# Patient Record
Sex: Female | Born: 1950 | Race: White | Hispanic: No | Marital: Married | Smoking: Current every day smoker
Health system: Southern US, Community
[De-identification: ages and names within clinical notes are randomized; demographics above are authoritative.]

## PROBLEM LIST (undated history)

## (undated) DIAGNOSIS — L309 Dermatitis, unspecified: Secondary | ICD-10-CM

## (undated) DIAGNOSIS — G8929 Other chronic pain: Secondary | ICD-10-CM

## (undated) DIAGNOSIS — I10 Essential (primary) hypertension: Secondary | ICD-10-CM

## (undated) DIAGNOSIS — M545 Low back pain: Secondary | ICD-10-CM

## (undated) DIAGNOSIS — F419 Anxiety disorder, unspecified: Secondary | ICD-10-CM

## (undated) DIAGNOSIS — M546 Pain in thoracic spine: Secondary | ICD-10-CM

## (undated) DIAGNOSIS — M549 Dorsalgia, unspecified: Secondary | ICD-10-CM

## (undated) HISTORY — PX: ABDOMINAL HYSTERECTOMY: SHX81

---

## 1998-01-15 ENCOUNTER — Emergency Department (HOSPITAL_COMMUNITY): Admission: EM | Admit: 1998-01-15 | Discharge: 1998-01-15 | Payer: Self-pay | Admitting: Emergency Medicine

## 1999-03-10 ENCOUNTER — Inpatient Hospital Stay (HOSPITAL_COMMUNITY): Admission: AD | Admit: 1999-03-10 | Discharge: 1999-03-13 | Payer: Self-pay | Admitting: *Deleted

## 1999-08-03 ENCOUNTER — Encounter: Payer: Self-pay | Admitting: Emergency Medicine

## 1999-08-03 ENCOUNTER — Emergency Department (HOSPITAL_COMMUNITY): Admission: EM | Admit: 1999-08-03 | Discharge: 1999-08-03 | Payer: Self-pay | Admitting: Emergency Medicine

## 2005-09-20 ENCOUNTER — Emergency Department (HOSPITAL_COMMUNITY): Admission: EM | Admit: 2005-09-20 | Discharge: 2005-09-20 | Payer: Self-pay | Admitting: Emergency Medicine

## 2009-04-22 ENCOUNTER — Emergency Department (HOSPITAL_COMMUNITY): Admission: EM | Admit: 2009-04-22 | Discharge: 2009-04-22 | Payer: Self-pay | Admitting: Emergency Medicine

## 2011-02-24 ENCOUNTER — Emergency Department (HOSPITAL_COMMUNITY): Payer: Medicare Other

## 2011-02-24 ENCOUNTER — Inpatient Hospital Stay (HOSPITAL_COMMUNITY)
Admission: EM | Admit: 2011-02-24 | Discharge: 2011-03-03 | DRG: 305 | Disposition: A | Discharge status: Home / Self Care | Payer: Medicare Other | Attending: Internal Medicine | Admitting: Internal Medicine

## 2011-02-24 DIAGNOSIS — F17200 Nicotine dependence, unspecified, uncomplicated: Secondary | ICD-10-CM | POA: Diagnosis present

## 2011-02-24 DIAGNOSIS — D6489 Other specified anemias: Secondary | ICD-10-CM | POA: Diagnosis present

## 2011-02-24 DIAGNOSIS — R51 Headache: Secondary | ICD-10-CM | POA: Diagnosis present

## 2011-02-24 DIAGNOSIS — G2589 Other specified extrapyramidal and movement disorders: Secondary | ICD-10-CM | POA: Diagnosis present

## 2011-02-24 DIAGNOSIS — I498 Other specified cardiac arrhythmias: Secondary | ICD-10-CM | POA: Diagnosis not present

## 2011-02-24 DIAGNOSIS — I10 Essential (primary) hypertension: Principal | ICD-10-CM | POA: Diagnosis present

## 2011-02-24 DIAGNOSIS — F411 Generalized anxiety disorder: Secondary | ICD-10-CM | POA: Diagnosis present

## 2011-02-24 DIAGNOSIS — D72829 Elevated white blood cell count, unspecified: Secondary | ICD-10-CM | POA: Diagnosis present

## 2011-02-24 DIAGNOSIS — R579 Shock, unspecified: Secondary | ICD-10-CM | POA: Diagnosis present

## 2011-02-24 DIAGNOSIS — F329 Major depressive disorder, single episode, unspecified: Secondary | ICD-10-CM | POA: Diagnosis present

## 2011-02-24 DIAGNOSIS — E876 Hypokalemia: Secondary | ICD-10-CM | POA: Diagnosis not present

## 2011-02-24 DIAGNOSIS — R0789 Other chest pain: Secondary | ICD-10-CM | POA: Diagnosis not present

## 2011-02-24 DIAGNOSIS — Z79899 Other long term (current) drug therapy: Secondary | ICD-10-CM

## 2011-02-24 DIAGNOSIS — F1320 Sedative, hypnotic or anxiolytic dependence, uncomplicated: Secondary | ICD-10-CM | POA: Diagnosis present

## 2011-02-24 DIAGNOSIS — G4700 Insomnia, unspecified: Secondary | ICD-10-CM | POA: Diagnosis present

## 2011-02-24 DIAGNOSIS — E871 Hypo-osmolality and hyponatremia: Secondary | ICD-10-CM | POA: Diagnosis present

## 2011-02-24 DIAGNOSIS — IMO0002 Reserved for concepts with insufficient information to code with codable children: Secondary | ICD-10-CM | POA: Diagnosis not present

## 2011-02-24 DIAGNOSIS — F29 Unspecified psychosis not due to a substance or known physiological condition: Secondary | ICD-10-CM | POA: Diagnosis not present

## 2011-02-25 ENCOUNTER — Emergency Department (HOSPITAL_COMMUNITY): Payer: Medicare Other

## 2011-02-25 LAB — CARDIAC PANEL(CRET KIN+CKTOT+MB+TROPI)
Relative Index: 2.6 — ABNORMAL HIGH (ref 0.0–2.5)
Troponin I: <0.3 ng/mL (ref <0.30)

## 2011-02-25 LAB — URINE MICROSCOPIC-ADD ON

## 2011-02-25 LAB — URINALYSIS, ROUTINE W REFLEX MICROSCOPIC
Bilirubin Urine: NEGATIVE
Glucose, UA: 100 mg/dL — AB
Hgb urine dipstick: NEGATIVE
Ketones, ur: 40 mg/dL — AB
Leukocytes, UA: NEGATIVE
Nitrite: NEGATIVE
Protein, ur: 30 mg/dL — AB
Specific Gravity, Urine: 1.014 (ref 1.005–1.030)
Urobilinogen, UA: 0.2 mg/dL (ref 0.0–1.0)
pH: 7 (ref 5.0–8.0)

## 2011-02-25 LAB — CBC
HCT: 34.8 % — ABNORMAL LOW (ref 36.0–46.0)
HCT: 36.4 % (ref 36.0–46.0)
Hemoglobin: 11.9 g/dL — ABNORMAL LOW (ref 12.0–15.0)
Hemoglobin: 12.4 g/dL (ref 12.0–15.0)
MCH: 31.3 pg (ref 26.0–34.0)
MCH: 31.4 pg (ref 26.0–34.0)
MCHC: 34.1 g/dL (ref 30.0–36.0)
MCHC: 34.2 g/dL (ref 30.0–36.0)
MCV: 91.6 fL (ref 78.0–100.0)
MCV: 92.2 fL (ref 78.0–100.0)
Platelets: 295 10*3/uL (ref 150–400)
Platelets: 318 10*3/uL (ref 150–400)
RBC: 3.8 MIL/uL — ABNORMAL LOW (ref 3.87–5.11)
RBC: 3.95 MIL/uL (ref 3.87–5.11)
RDW: 13.8 % (ref 11.5–15.5)
RDW: 14 % (ref 11.5–15.5)
WBC: 11.6 10*3/uL — ABNORMAL HIGH (ref 4.0–10.5)
WBC: 14.4 10*3/uL — ABNORMAL HIGH (ref 4.0–10.5)

## 2011-02-25 LAB — DIFFERENTIAL
Eosinophils Absolute: 0.1 10*3/uL (ref 0.0–0.7)
Eosinophils Relative: 1 % (ref 0–5)
Lymphocytes Relative: 11 % — ABNORMAL LOW (ref 12–46)
Lymphocytes Relative: 6 % — ABNORMAL LOW (ref 12–46)
Lymphs Abs: 0.8 10*3/uL (ref 0.7–4.0)
Lymphs Abs: 1.2 10*3/uL (ref 0.7–4.0)
Monocytes Absolute: 0.7 10*3/uL (ref 0.1–1.0)
Monocytes Relative: 6 % (ref 3–12)
Monocytes Relative: 6 % (ref 3–12)
Neutrophils Relative %: 88 % — ABNORMAL HIGH (ref 43–77)

## 2011-02-25 LAB — BASIC METABOLIC PANEL
BUN: 13 mg/dL (ref 6–23)
Calcium: 9.1 mg/dL (ref 8.4–10.5)
Chloride: 86 mEq/L — ABNORMAL LOW (ref 96–112)
Creatinine, Ser: 0.52 mg/dL (ref 0.50–1.10)
GFR calc Af Amer: >60 mL/min (ref >60)

## 2011-02-25 LAB — COMPREHENSIVE METABOLIC PANEL
BUN: 17 mg/dL (ref 6–23)
CO2: 22 mEq/L (ref 19–32)
Calcium: 9.7 mg/dL (ref 8.4–10.5)
Chloride: 86 mEq/L — ABNORMAL LOW (ref 96–112)
Creatinine, Ser: 0.69 mg/dL (ref 0.50–1.10)
GFR calc Af Amer: >60 mL/min (ref >60)
GFR calc non Af Amer: >60 mL/min (ref >60)
Glucose, Bld: 153 mg/dL — ABNORMAL HIGH (ref 70–99)
Total Bilirubin: 0.5 mg/dL (ref 0.3–1.2)

## 2011-02-25 LAB — CK TOTAL AND CKMB (NOT AT ARMC)
CK, MB: 15.8 ng/mL (ref 0.3–4.0)
Relative Index: 3 — ABNORMAL HIGH (ref 0.0–2.5)
Total CK: 534 U/L — ABNORMAL HIGH (ref 7–177)

## 2011-02-25 LAB — MRSA PCR SCREENING: MRSA by PCR: NEGATIVE

## 2011-02-25 LAB — MAGNESIUM: Magnesium: 1.6 mg/dL (ref 1.5–2.5)

## 2011-02-25 LAB — POCT I-STAT TROPONIN I: Troponin i, poc: 0.01 ng/mL (ref 0.00–0.08)

## 2011-02-25 LAB — CK: Total CK: 546 U/L — ABNORMAL HIGH (ref 7–177)

## 2011-02-25 LAB — TROPONIN I: Troponin I: <0.3 ng/mL (ref <0.30)

## 2011-02-25 NOTE — H&P (Signed)
Ariana Gilbert, Ariana NO.:  [id number]  MEDICAL RECORD NO.:  [id number]  LOCATION:  WLED                         FACILITY:  [facility]  PHYSICIAN:  Talmage Nap, MD  DATE OF BIRTH:  Nov 20, 1950  DATE OF ADMISSION:  02/24/2011 DATE OF DISCHARGE:                             HISTORY & PHYSICAL   PRIMARY CARE PHYSICIAN:  [name], M.D.  History obtainable from the patient.  CHIEF COMPLAINTS:  Headache and dizziness which started at about 11:30 p.m. on 02/24/2011.  HISTORY OF PRESENT ILLNESS:  The patient is a 60 year old Caucasian female with history of anxiety disorder, depression, and hypertension ironically not on any medication, presenting to the emergency room with headache which started at about 11:30 p.m. on 02/24/2011.  The patient claimed that for months she has been having on and off frontal headache with no associated systemic symptoms, however, the present episode started suddenly at about 11:30 p.m. on 0912/2012.  Headache was said tobe throbbing, about 8/10 intensity, and was radiating to the back up to the occipital region.  This was said to be associated with dizzy spell and photophobia.  She claimed she was nauseated, but denied any vomiting.  She denied any fever.  She denied any chills.  She denied any rigor.  She denied any history of neck stiffness.  She denied any weakness on any side of the body.  She also denied any chest pain or shortness of breath.  The patient also denied any history of passing out.  Headache was said to have persisted, hence the patient called EMS who brought the patient to the hospital to be evaluated.  In the emergency room the patient was found to have markedly elevated blood pressure, BP was 195/107.  She was started on IV labetalol.  At the time the patient was seen by me in the emergency room, headache has remarkably subsided and her blood pressure was 129/76, pulse was 67, respiratory rate 12.  She  denied any associated blurry vision.  She denied any nausea or vomiting.  She denied any fever.  No chills.  No rigor.  She also denied any chest pain or shortness of breath.  After evaluation the patient was advised to be admitted for stabilization before being discharged.  PAST MEDICAL HISTORY: 1. Positive for hypertension - ironically not on any antihypertensive     medication. 2. Anxiety disorder. 3. Depression. 4. Periodontal disease.  PREADMISSION MEDS:  Include Xanax and Celexa.  ALLERGIES: 1. HALOPERIDOL. 2. PERCODAN. 3. TRAZODONE.  PAST SURGICAL HISTORY:  Hysterectomy and tonsillectomy.  SOCIAL HISTORY:  Positive for tobacco use.  Smokes about a pack of cigarettes a day for years and she claims she is a social drinker.  She is to be a retired Runner, broadcasting/film/video, but periodically works at an [employer].  FAMILY HISTORY:  Essentially positive for hypertension.  REVIEW OF SYSTEMS:  The patient presently denies any headaches.  No blurred vision.  No nausea or vomiting.  No fever.  No chills.  No rigor.  No chest pain or shortness of breath.  No cough.  No abdominal discomfort.  No diarrhea or hematochezia.  No dysuria or hematuria.  No swelling of the  lower extremities.  No intolerance to heat or cold and no neuropsychiatric disorder.  PHYSICAL EXAMINATION:  GENERAL:  On examination, middle-aged lady not in any respiratory distress, well hydrated, has flushing of the face. VITAL SIGNS:  At present vital signs are blood pressure 129/76, pulse 67, respiratory rate 20, pain scale is 12. HEENT:  Pupils are reactive to light and extraocular muscles are intact. NECK:  No jugular venous distention.  No carotid bruit.  No lymphadenopathy. Chest:  Clear to auscultation. HEART:  Sounds are 1 and 2. ABDOMEN:  Soft, nontender.  Liver, spleen and kidney not palpable. Bowel sounds are positive. EXTREMITIES:  No pedal edema. NEUROLOGIC EXAM:  Nonfocal. MUSCULOSKELETAL SYSTEM:   Unremarkable. SKIN:  Showed flushing on the face with area of erythema in the left lower leg with a silvery plaques.  LAB DATA:  Hematology indices showed WBC of 11.6, hemoglobin 12.4, hematocrit 36.4, MCV of 92.2 with a platelet count of 295, neutrophils 83% and absolute neutrophil count is 9.6.  Chemistry showed sodium of 122, potassium of 4.5, chloride of 88 with a bicarb of 19, glucose is 133, BUN is 13, creatinine 0.52.  Urinalysis unremarkable.  Urine microscopy showed wbc related to rare bacteria.  EKG showed normal sinus rhythm with a rate of 78 and LVH with nonspecific T-wave changes in the anterolateral leads.  CT of the head without contrast did not show any acute process, i.e., no hemorrhage and no mass seen.  IMPRESSION: 1. Hypertensive emergency, bp now is controlled. 2. Anxiety disorder. 3. Depression. 4. Leukocytosis. 5. Hyponatremia.  PLAN:  Plan is to admit the patient to telemetry.  The patient will be on saline lock.  She will be on aspirin 81 mg p.o. daily.  Blood pressure will be maintained with Lopressor 50 mg p.o. b.i.d., lisinopril 10 mg p.o. daily and diet will be controlled with Xanax 1 mg p.o. b.i.d. p.r.n. and depression with Celexa 40 mg p.o. daily.  Since the patient is found to have leukocytosis, she empirically will be treated with Levaquin 500 mg p.o. daily.  Headache will also be controlled with Dilaudid 0.5 mg IV q.4 h. p.r.n.  GI prophylaxis will be done with Protonix 40 mg p.o. daily and TED stockings for DVT prophylaxis.  Further workup to be done on this patient will include cardiac enzymes q.6 x3.  CBC, CMP and magnesium will be repeated in a.m.  Imaging study to be done will include 2-D echo and carotid duplex.  The patient will be followed and evaluated on day- to-day basis.     Talmage Nap, MD     CN/MEDQ  D:  02/25/2011  T:  02/25/2011  Job:  [id number]  Electronically Signed by Talmage Nap  on 02/25/2011 06:04:45 AM

## 2011-02-26 ENCOUNTER — Encounter (HOSPITAL_COMMUNITY): Payer: Self-pay

## 2011-02-26 ENCOUNTER — Inpatient Hospital Stay (HOSPITAL_COMMUNITY): Payer: Medicare Other

## 2011-02-26 DIAGNOSIS — Z79899 Other long term (current) drug therapy: Secondary | ICD-10-CM

## 2011-02-26 DIAGNOSIS — E871 Hypo-osmolality and hyponatremia: Secondary | ICD-10-CM

## 2011-02-26 DIAGNOSIS — I509 Heart failure, unspecified: Secondary | ICD-10-CM

## 2011-02-26 DIAGNOSIS — F05 Delirium due to known physiological condition: Secondary | ICD-10-CM

## 2011-02-26 DIAGNOSIS — R079 Chest pain, unspecified: Secondary | ICD-10-CM

## 2011-02-26 DIAGNOSIS — R42 Dizziness and giddiness: Secondary | ICD-10-CM

## 2011-02-26 DIAGNOSIS — F411 Generalized anxiety disorder: Secondary | ICD-10-CM

## 2011-02-26 LAB — COMPREHENSIVE METABOLIC PANEL
ALT: 18 U/L (ref 0–35)
AST: 26 U/L (ref 0–37)
CO2: 20 mEq/L (ref 19–32)
Calcium: 7.8 mg/dL — ABNORMAL LOW (ref 8.4–10.5)
GFR calc non Af Amer: >60 mL/min (ref >60)
Potassium: 3.8 mEq/L (ref 3.5–5.1)
Sodium: 119 mEq/L — CL (ref 135–145)
Total Protein: 6 g/dL (ref 6.0–8.3)

## 2011-02-26 LAB — CARDIAC PANEL(CRET KIN+CKTOT+MB+TROPI)
CK, MB: 13.9 ng/mL (ref 0.3–4.0)
Troponin I: <0.3 ng/mL (ref <0.30)

## 2011-02-26 LAB — RAPID URINE DRUG SCREEN, HOSP PERFORMED: Opiates: NOT DETECTED

## 2011-02-26 LAB — BASIC METABOLIC PANEL
BUN: 17 mg/dL (ref 6–23)
BUN: 18 mg/dL (ref 6–23)
BUN: 16 mg/dL (ref 6–23)
Calcium: 8.1 mg/dL — ABNORMAL LOW (ref 8.4–10.5)
Calcium: 8.8 mg/dL (ref 8.4–10.5)
Chloride: 91 mEq/L — ABNORMAL LOW (ref 96–112)
Chloride: 90 mEq/L — ABNORMAL LOW (ref 96–112)
Creatinine, Ser: 0.78 mg/dL (ref 0.50–1.10)
Creatinine, Ser: 0.73 mg/dL (ref 0.50–1.10)
GFR calc Af Amer: >60 mL/min (ref >60)
GFR calc Af Amer: >60 mL/min (ref >60)
GFR calc Af Amer: >60 mL/min (ref >60)
GFR calc Af Amer: >60 mL/min (ref >60)
GFR calc Af Amer: >60 mL/min (ref >60)
GFR calc non Af Amer: >60 mL/min (ref >60)
GFR calc non Af Amer: >60 mL/min (ref >60)
GFR calc non Af Amer: >60 mL/min (ref >60)
Glucose, Bld: 146 mg/dL — ABNORMAL HIGH (ref 70–99)
Potassium: 3.8 mEq/L (ref 3.5–5.1)
Potassium: 3.9 mEq/L (ref 3.5–5.1)
Sodium: 118 mEq/L — CL (ref 135–145)
Sodium: 121 mEq/L — ABNORMAL LOW (ref 135–145)

## 2011-02-26 LAB — IRON AND TIBC
Saturation Ratios: 22 % (ref 20–55)
TIBC: 331 ug/dL (ref 250–470)

## 2011-02-26 LAB — CBC
Hemoglobin: 10.4 g/dL — ABNORMAL LOW (ref 12.0–15.0)
MCHC: 33.9 g/dL (ref 30.0–36.0)
WBC: 11.7 10*3/uL — ABNORMAL HIGH (ref 4.0–10.5)

## 2011-02-26 LAB — DIFFERENTIAL
Basophils Absolute: 0 10*3/uL (ref 0.0–0.1)
Basophils Relative: 0 % (ref 0–1)
Monocytes Absolute: 1.3 10*3/uL — ABNORMAL HIGH (ref 0.1–1.0)
Neutro Abs: 9.1 10*3/uL — ABNORMAL HIGH (ref 1.7–7.7)
Neutrophils Relative %: 78 % — ABNORMAL HIGH (ref 43–77)

## 2011-02-26 LAB — TSH: TSH: 6.412 u[IU]/mL — ABNORMAL HIGH (ref 0.350–4.500)

## 2011-02-26 LAB — PROCALCITONIN: Procalcitonin: <0.1 ng/mL

## 2011-02-26 LAB — LACTIC ACID, PLASMA: Lactic Acid, Venous: 2.2 mmol/L (ref 0.5–2.2)

## 2011-02-26 LAB — NA AND K (SODIUM & POTASSIUM), RAND UR
Potassium Urine: 65 mEq/L
Sodium, Ur: 20 mEq/L

## 2011-02-26 LAB — VITAMIN B12: Vitamin B-12: 341 pg/mL (ref 211–911)

## 2011-02-26 LAB — CREATININE, URINE, RANDOM: Creatinine, Urine: 149.7 mg/dL

## 2011-02-26 LAB — FOLATE: Folate: >20 ng/mL

## 2011-02-26 LAB — LITHIUM LEVEL: Lithium Lvl: <0.25 mEq/L — ABNORMAL LOW (ref 0.80–1.40)

## 2011-02-27 LAB — T3, FREE: T3, Free: 3.4 pg/mL (ref 2.3–4.2)

## 2011-02-27 LAB — BASIC METABOLIC PANEL
BUN: 15 mg/dL (ref 6–23)
BUN: 12 mg/dL (ref 6–23)
CO2: 24 mEq/L (ref 19–32)
CO2: 28 mEq/L (ref 19–32)
Calcium: 8.9 mg/dL (ref 8.4–10.5)
Calcium: 8.6 mg/dL (ref 8.4–10.5)
Calcium: 8.8 mg/dL (ref 8.4–10.5)
Chloride: 97 mEq/L (ref 96–112)
Creatinine, Ser: 0.62 mg/dL (ref 0.50–1.10)
Creatinine, Ser: 0.55 mg/dL (ref 0.50–1.10)
Creatinine, Ser: 0.69 mg/dL (ref 0.50–1.10)
GFR calc Af Amer: >60 mL/min (ref >60)
GFR calc non Af Amer: >60 mL/min (ref >60)
Glucose, Bld: 111 mg/dL — ABNORMAL HIGH (ref 70–99)
Glucose, Bld: 124 mg/dL — ABNORMAL HIGH (ref 70–99)
Sodium: 130 mEq/L — ABNORMAL LOW (ref 135–145)

## 2011-02-28 DIAGNOSIS — E871 Hypo-osmolality and hyponatremia: Secondary | ICD-10-CM

## 2011-02-28 DIAGNOSIS — F331 Major depressive disorder, recurrent, moderate: Secondary | ICD-10-CM

## 2011-02-28 DIAGNOSIS — F411 Generalized anxiety disorder: Secondary | ICD-10-CM

## 2011-02-28 DIAGNOSIS — F05 Delirium due to known physiological condition: Secondary | ICD-10-CM

## 2011-02-28 LAB — GLUCOSE, CAPILLARY

## 2011-02-28 LAB — BASIC METABOLIC PANEL
BUN: 10 mg/dL (ref 6–23)
CO2: 28 mEq/L (ref 19–32)
Chloride: 99 mEq/L (ref 96–112)
Creatinine, Ser: 0.54 mg/dL (ref 0.50–1.10)
GFR calc Af Amer: >60 mL/min (ref >60)

## 2011-02-28 LAB — CBC
HCT: 32.3 % — ABNORMAL LOW (ref 36.0–46.0)
MCHC: 32.8 g/dL (ref 30.0–36.0)
RDW: 14.4 % (ref 11.5–15.5)

## 2011-03-01 ENCOUNTER — Ambulatory Visit (HOSPITAL_COMMUNITY): Payer: Medicare Other

## 2011-03-01 LAB — CBC
HCT: 33.3 % — ABNORMAL LOW (ref 36.0–46.0)
MCH: 30.7 pg (ref 26.0–34.0)
MCHC: 32.1 g/dL (ref 30.0–36.0)
MCV: 95.7 fL (ref 78.0–100.0)
RDW: 14.5 % (ref 11.5–15.5)

## 2011-03-01 LAB — BASIC METABOLIC PANEL
BUN: 10 mg/dL (ref 6–23)
Calcium: 9.1 mg/dL (ref 8.4–10.5)
Creatinine, Ser: 0.61 mg/dL (ref 0.50–1.10)
GFR calc Af Amer: >60 mL/min (ref >60)
GFR calc non Af Amer: >60 mL/min (ref >60)

## 2011-03-01 MED ORDER — TECHNETIUM TC 99M TETROFOSMIN IV KIT
30.0000 | PACK | Freq: Once | INTRAVENOUS | Status: AC | PRN
  Administered 2011-03-01: 30 via INTRAVENOUS

## 2011-03-01 MED ORDER — TECHNETIUM TC 99M TETROFOSMIN IV KIT
10.0000 | PACK | Freq: Once | INTRAVENOUS | Status: AC | PRN
  Administered 2011-03-01: 10 via INTRAVENOUS

## 2011-03-01 NOTE — Consult Note (Signed)
NAMEJARI, CAROLLO             ACCOUNT NO.:  [id number]  MEDICAL RECORD NO.:  [id number]  LOCATION:  1230                         FACILITY:  [facility]  PHYSICIAN:  David Towson T. Tara Wich, M.D.   DATE OF BIRTH:  1951-05-27  DATE OF CONSULTATION:  02/28/2011 DATE OF DISCHARGE:                                CONSULTATION   HISTORY OF PRESENT ILLNESS:  The patient is a 60 year old Caucasian female who is married and part-time employed, admitted on the medical floor due to increased blood pressure, having dizzy spells, and throbbing pain.  Apparently, the patient has been noncompliant with her medication.  She has a history of hypertension, depression, and anxiety disorder.  The patient also reported initially chest pain, but also reported being very depressed and anxious.  She requested to be seen by a psychiatrist.  The patient told that she is having more depressive symptoms in past few months.  Her father died on 2024-07-28who she was very close to her and since then she is having increased crying spells, anhedonia, feeling of hopeless and helpless, and poor sleep.  She has been seeing her primary care doctor, Dr. Johny Blamer who has been managing her psychiatric medication, Celexa 40 mg and Xanax 1 mg which she has been taking 5 times a day.  However, despite taking Xanax 5 times a day, her anxiety has been not under control.  She continued to have crying spells and feeling of worthless and hopeless.  She also admitted increased stress at her job.  She is a part-time Engineer, site, however, she is living on paycheck to paycheck.  She is under enormous financial distress and feels that she cannot take it anymore. Though she denies any active suicidal thinking, however, admitted having passive suicidal thoughts and sometimes no desire to live.  She denies any psychotic symptoms or having any visual or auditory hallucinations or any anger or violent behavior.  PAST PSYCHIATRIC HISTORY:   The patient admitted history of being admitted in the hospital at [facility] many years ago for severe depression, but she does not remember the details very well. She was also seen by Dr. Elyn Peers at [facility] many years ago, but she does not remember the details.  She also denies any previous history of suicidal attempt or prior response of any antidepressant.  She has been seeing and getting antidepressant by Dr. Tiburcio Pea for past 10 years and has been managed on Celexa 40 mg.  She is also getting Xanax 1 mg 5 times a day.  ALCOHOL AND DRUG HISTORY:  The patient admitted history of drinking alcohol on occasion, but denies any history of withdrawals, intoxication, or blackouts.  SOCIAL HISTORY:  The patient lives with her 2nd husband.  The patient admitted the husband has been very supportive.  The patient also has children from her first marriage who live close by and has been in contact with them.  FAMILY HISTORY:  The patient does not remember anybody of her family who has psychiatric illness.  ALLERGIES:  The patient is allergic to, 1. HALDOL. 2. TRAZODONE. 3. PERCODAN.  MEDICAL HISTORY:  The patient has a history of hypertension, recently  admitted with hypertensive emergency.  She also has hyponatremia, leukocytosis.  CURRENT MEDICATIONS: 1. Celexa 40 mg daily. 2. Xanax 1 mg 5 times a day. 3. Aspirin 81 mg daily. 4. She was started on antihypertensive medication on the medical     floor.  MENTAL STATUS EXAM:  The patient is a middle-aged woman who appears older than her stated age.  She maintained poor eye contact.  Her thought process was slow.  Her speech is also very soft.  At times, she was tearful.  She described her mood is depressed and anxious.  Her affect was constricted.  She denies any active suicidal thoughts or any visual or auditory hallucination, though endorsed passive suicidal thoughts with no plan.  Her speech is  soft, as mentioned above.  Her attention and concentration were also distracted at times, however, there were no psychosis including delusion or paranoia present.  She is alert and oriented x3.  Her recent memory was intact, however, she has difficulty recalling old events.  She is alert and oriented x3.  Her insight and judgment was fair.  Her impulse control were okay.  DIAGNOSES:  Axis I:  Major depressive disorder, recurrent. Axis II:  Deferred. Axis III:  See medical history. Axis IV:  Moderate. Axis V:  50-55.  PLAN:  I offered voluntary inpatient admission to the [facility] which the patient agreed at this time as she felt that she cannot take it anymore and like to be admitted until she gets better mentally. We will recommend transfer to [facility] when the patient is medically cleared for further inpatient treatment and stabilization.  Thank you for giving me opportunity to involve in the patient care.     Trenten Watchman T. Lolly Mustache, M.D.     STA/MEDQ  D:  02/28/2011  T:  02/28/2011  Job:  [id number]  Electronically Signed by Kathryne Sharper M.D. on 03/01/2011 03:04:12 PM

## 2011-03-02 DIAGNOSIS — F331 Major depressive disorder, recurrent, moderate: Secondary | ICD-10-CM

## 2011-03-04 LAB — CULTURE, BLOOD (ROUTINE X 2)
Culture  Setup Time: 201209140847
Culture: NO GROWTH

## 2011-03-07 LAB — METANEPHRINES, URINE, 24 HOUR
Metaneph Total, Ur: 489 mcg/24 h (ref 224–832)
Metanephrines, Ur: 154 mcg/24h (ref 90–315)
Volume, Urine-METAN: 2550 mL

## 2011-03-08 NOTE — Consult Note (Signed)
Ariana Gilbert, Ariana Gilbert NO.:  [id number]  MEDICAL RECORD NO.:  [id number]  LOCATION:  1230                         FACILITY:  [facility]  PHYSICIAN:  Madolyn Frieze. Jens Som, MD, FACCDATE OF BIRTH:  [birth year]  DATE OF CONSULTATION:  02/26/2011 DATE OF DISCHARGE:                                CONSULTATION   HISTORY:  [name] is a 60 year old female with a history of anxiety disorder, hypertension, whom I am asked to evaluate for chest pain.  The patient was admitted on September 12th with complaints of headache, nausea and vomiting, and elevated blood pressure.  Her initial sodium was 122 with a potassium of 4.5.  She was treated with Lopressor and subsequently developed hypotension.  Note, she has had a brain MRI since this admission, which showed mild global atrophy and mild-to-moderate white matter changes.  She apparently complained of an episode of chest pain and her CK-MB was elevated, but her troponin was normal. Cardiology is therefore asked to further evaluate.  Prior to admission, the patient does describe occasional dyspnea on exertion that she attributes to tobacco abuse.  There is no orthopnea, PND, or pedal edema.  She does not had exertional chest pain.  She also denies any chest pain since she has been in the hospital at this time.  Her medications at present include, 1. Aspirin 81 mg p.o. daily. 2. Dulcolax suppository. 3. Levaquin 500 mg p.o. daily. 4. Magnesium oxide. 5. Protonix 40 mg p.o. b.i.d. and p.r.n. medications.  ALLERGIES: 1. HALDOL. 2. TRAZODONE. 3. SULFA.  SOCIAL HISTORY:  She does smoke.  She occasionally consumes alcohol.  FAMILY HISTORY:  Negative for coronary artery disease.  PAST MEDICAL HISTORY: 1. Anxiety disorder. 2. She also has hypertension.  PAST SURGICAL HISTORY:  She has had a previous tonsillectomy and hysterectomy.  REVIEW OF SYSTEMS:  She denies any fever or chills, but she has had a productive cough.  She  has also complained of headaches.  There is no hemoptysis.  There is no dysphagia, odynophagia, melena, or hematochezia.  She has had nausea and vomiting.  There is no dysuria, hematuria, or other urological symptoms.  There is no orthopnea, PND, or pedal edema.  She has had problems with anxiety.  Remaining systems are negative.  PHYSICAL EXAMINATION:  VITAL SIGNS:  Her blood pressure is 104/60 and her is pulse 58.  Her temperature is 97.7.  She is 96% on room air. GENERAL:  She is well developed and well nourished in no acute distress at present. SKIN:  Warm and dry.  She does have some anxiety.  There is no peripheral clubbing. BACK:  Normal. HEENT:  Normal.  Normal eyelids. NECK:  Supple with normal upstroke bilaterally.  I cannot appreciate bruits or thyromegaly. CHEST:  Clear to auscultation.  No expansion. CARDIOVASCULAR:  Regular rhythm.  Normal S1, S2.  There are no murmurs, rubs, or gallops noted. ABDOMEN:  Nontender, distended.  Positive sounds.  No hepatosplenomegaly.  No mass appreciate.  There is no abdominal bruit. She has 2+ femoral pulses bilaterally.  No bruits. EXTREMITIES:  No edema that I could palpate.  No cords.  She has 2+ dorsalis pedis pulses bilaterally. NEURO:  Grossly intact.  LABORATORY DATA:  Sodium of 121.  Potassium is 3.8.  BUN and creatinine are 17 and 0.59.  A TSH is mildly elevated at 6.412.  Urine osmolality is 645.  Serum osmolality is 253.  Cortisol was 19.9.  Cardiac markers show mildly elevated CK-MB with the most recent being 13.9, but her troponins have all been normal.  Liver functions are normal.  Chest x- ray from today shows mild vascular congestion and cardiomegaly without significant pulmonary edema.  Electrocardiogram shows a sinus rhythm with inferolateral T-wave inversion.  There is left axis deviation.  There is no RV conduction delay.  DIAGNOSES: 1. Chest pain - the patient denies any chest pain to me at this time.      Her CK-MB is mildly elevated, but her troponins are normal.  We     will plan to await her echocardiogram.  If it is negative, then she     can have an outpatient Myoview once this hospitalizations complete     and further workup of her hyponatremia is complete. 2. Hyponatremia - the patient has significant hyponatremia and     evaluation is ongoing.  It may be worthwhile to have Nephrology see     the patient.  I will check urine metanephrines and catecholamines,     given her labile blood pressure to exclude pheochromocytoma.  I     think this is unlikely.  Note, her cortisol level was normal. 3. Anxiety disorder - this is being managed by primary care. 4. History of hypertension - her blood pressure medications on hold     and she has had problems with hypotension.     Madolyn Frieze Jens Som, MD, [facility]     BSC/MEDQ  D:  02/26/2011  T:  02/27/2011  Job:  [id number]  Electronically Signed by Olga Millers MD [facility] on 03/08/2011 04:54:09 PM

## 2011-03-31 NOTE — Discharge Summary (Signed)
[name], Gilbert             ACCOUNT NO.:  [id number]  MEDICAL RECORD NO.:  [id number]  LOCATION:  1230                         FACILITY:  [facility]  PHYSICIAN:  [name], MD      DATE OF BIRTH:  [birth year]  DATE OF ADMISSION:  02/24/2011 DATE OF DISCHARGE:  03/02/2011                              DISCHARGE SUMMARY   PRIMARY CARE PHYSICIAN:  [name], M.D.  DISCHARGE DIAGNOSES: 1. Episode of agitation, confusion, and fluctuation of blood pressure     thought to be possibility of serotonin syndrome. 2. Hyponatremia. 3. Hypotension, requesting pressor for a short period of time. 4. Anxiety disorder. 5. Depression. 6. Major depressive disorder. 7. Insomnia. 8. Anemia, with a stable hemoglobin of around 10.7 and further workup     as outpatient. 9. Subclinical hypothyroidism. 10.Chest pain, status post negative stress test. 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  PROCEDURE: 1. Status post arterial line. 2. Status post central line. 3. MRI of the brain, negative. 4. Chest x-ray, no acute cardiopulmonary process. 5. Stress test which did show no evidence reversibility, normal     function, estimated ejection fraction 64%. 6. Echocardiogram did show wall thickness, has a normal ejection     fraction 55% to 60%.  Doppler parameters consistent with abnormal     left ventricular relaxation, grade 1 diastolic dysfunction, mildly     thickened leaflet, mildly thickened aortic mitral valve.  CONSULTATIONS:  Critical care consulted and Cardiology consulted.  Psych also consulted.  HISTORY OF PRESENT ILLNESS:  This is a 60 year old Caucasian female, who is here for an anxiety disorder, on chronic Xanax dose, history of benzodiazepine dependence, depression, and hypertensive, not on medication, presented to the ED with headache, dizziness and photophobia.  The patient found to have a blood pressure of 195/107, and was on labetalol and it has remarkably subsided, had blood  pressure 129/76 and pulse rate was 67.  Denies any fever, no chills, no rigors and no meningismus sign.  Accordingly, the patient admitted to Step- Down.  Also, the patient noticed to have a sodium of 120 and 119. Unfortunately, drug screen was not done at that time and alcohol level was not done.  Secondary to agitation and confusion, the patient admitted to the Step-Down. 1. Confusion, agitation, fluctuating blood pressure, and as we     mentioned for blood pressure, the patient was started on Lopressor     and lisinopril, and blood pressure significantly dropped.     Requesting CCM consultation and central line and A line     preparation.  The patient placed on pressor for almost 2 days.     Evaluation felt that this could be serotonin syndrome.  The lactic     acid was normal.  Procalcitonin was normal.  There is no evidence     of septic shock in her.  Also, blood samples sent to rule out     pheochromocytoma including metanephrine and normetanephrine.  The     patient weaned off pressor.  Blood pressure currently mild elevated     at 114/87 and we will add Norvasc 2.5 mg very carefully with     titrating of her blood pressure medication.  2. Chest pain and some T-wave inversion on inferior lead.  Cardiology     consulted and the patient underwent 2-D echo and a stress test     which did not show any evidence of ischemia. 3. Secondary to agitation and benzodiazepine dependence, Psychiatry     consulted and the patient offered to voluntarily commit to     [facility] for further treatment and the patient agreed. 4. Smoking.  Nicotine patch. 5. Possibility of Celexa poisoning.  We cannot exclude it with current     symptoms of agitation, confusion, and fluctuating blood pressure.     Also, there is no full picture of serotonin syndrome which includes     hyperthermia and tachycardia, but with the hyponatremia, also high     susceptibility, this could be secondary to Celexa.   Accordingly,     Celexa discontinued and we recommended Behavioral Health,     psychiatrist to recommend further medication for depression. 6. Insomnia to be managed by Psych.  DISCHARGE MEDICATIONS:  Currently discharge medications will be: 1. Norvasc 2.5 mg p.o. daily. 2. Aspirin 81 mg p.o. daily. 3. Xanax 0.5 mg p.o. b.i.d., __________ the patient use 1 mg p.o. 5     times a day. 4. Nicotine patch 21 mg transdermally daily for 21 days.  Currently, we feel the patient is medically stable to be transferred to [facility] if bed available, otherwise the patient will be transferred to regular floor.     [name], MD     HIE/MEDQ  D:  03/02/2011  T:  03/02/2011  Job:  [id number]  cc:   Holley Bouche, M.D. Fax: [phone]  Electronically Signed by Ebony Cargo MD on 03/31/2011 02:12:28 PM

## 2011-06-04 ENCOUNTER — Emergency Department (HOSPITAL_COMMUNITY)
Admission: EM | Admit: 2011-06-04 | Discharge: 2011-06-04 | Disposition: A | Discharge status: Home / Self Care | Payer: Medicare Other | Attending: Emergency Medicine | Admitting: Emergency Medicine

## 2011-06-04 ENCOUNTER — Encounter (HOSPITAL_COMMUNITY): Payer: Self-pay | Admitting: *Deleted

## 2011-06-04 ENCOUNTER — Other Ambulatory Visit: Payer: Self-pay

## 2011-06-04 DIAGNOSIS — F419 Anxiety disorder, unspecified: Secondary | ICD-10-CM

## 2011-06-04 DIAGNOSIS — F411 Generalized anxiety disorder: Secondary | ICD-10-CM | POA: Insufficient documentation

## 2011-06-04 DIAGNOSIS — R079 Chest pain, unspecified: Secondary | ICD-10-CM | POA: Insufficient documentation

## 2011-06-04 DIAGNOSIS — F17200 Nicotine dependence, unspecified, uncomplicated: Secondary | ICD-10-CM | POA: Insufficient documentation

## 2011-06-04 HISTORY — DX: Anxiety disorder, unspecified: F41.9

## 2011-06-04 HISTORY — DX: Essential (primary) hypertension: I10

## 2011-06-04 LAB — RAPID URINE DRUG SCREEN, HOSP PERFORMED
Amphetamines: NOT DETECTED
Barbiturates: NOT DETECTED
Benzodiazepines: POSITIVE — AB
Cocaine: NOT DETECTED
Opiates: NOT DETECTED
Tetrahydrocannabinol: NOT DETECTED

## 2011-06-04 LAB — URINALYSIS, ROUTINE W REFLEX MICROSCOPIC
Bilirubin Urine: NEGATIVE
Glucose, UA: NEGATIVE mg/dL
Hgb urine dipstick: NEGATIVE
Specific Gravity, Urine: 1.01 (ref 1.005–1.030)
Urobilinogen, UA: 0.2 mg/dL (ref 0.0–1.0)
pH: 6.5 (ref 5.0–8.0)

## 2011-06-04 LAB — COMPREHENSIVE METABOLIC PANEL
ALT: 31 U/L (ref 0–35)
AST: 29 U/L (ref 0–37)
Albumin: 4.5 g/dL (ref 3.5–5.2)
CO2: 25 mEq/L (ref 19–32)
Calcium: 10 mg/dL (ref 8.4–10.5)
Chloride: 93 mEq/L — ABNORMAL LOW (ref 96–112)
GFR calc non Af Amer: 69 mL/min — ABNORMAL LOW (ref >90)
Sodium: 131 mEq/L — ABNORMAL LOW (ref 135–145)

## 2011-06-04 LAB — CBC
Platelets: 228 10*3/uL (ref 150–400)
RDW: 13.9 % (ref 11.5–15.5)
WBC: 6.3 10*3/uL (ref 4.0–10.5)

## 2011-06-04 LAB — POCT I-STAT TROPONIN I: Troponin i, poc: 0 ng/mL (ref 0.00–0.08)

## 2011-06-04 LAB — DIFFERENTIAL
Basophils Absolute: 0 10*3/uL (ref 0.0–0.1)
Basophils Relative: 0 % (ref 0–1)
Eosinophils Relative: 1 % (ref 0–5)
Lymphocytes Relative: 12 % (ref 12–46)
Neutro Abs: 4.8 10*3/uL (ref 1.7–7.7)

## 2011-06-04 MED ORDER — BENZONATATE 100 MG PO CAPS
100.0000 mg | ORAL_CAPSULE | Freq: Once | ORAL | Status: AC
  Administered 2011-06-04: 100 mg via ORAL
  Filled 2011-06-04: qty 1

## 2011-06-04 MED ORDER — LORAZEPAM 1 MG PO TABS
1.0000 mg | ORAL_TABLET | Freq: Three times a day (TID) | ORAL | Status: DC | PRN
  Administered 2011-06-04: 1 mg via ORAL
  Filled 2011-06-04: qty 1

## 2011-06-04 MED ORDER — ACETAMINOPHEN 325 MG PO TABS: 650.0000 mg | ORAL_TABLET | ORAL | Status: DC | PRN

## 2011-06-04 MED ORDER — LORAZEPAM 1 MG PO TABS
2.0000 mg | ORAL_TABLET | Freq: Once | ORAL | Status: AC
  Administered 2011-06-04: 2 mg via ORAL
  Filled 2011-06-04: qty 2
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
## 2011-06-04 NOTE — ED Notes (Signed)
Pt having difficulty answering questions & staying on track.

## 2011-06-04 NOTE — ED Notes (Signed)
Per pt. Husband, states that he is worried about pt. Falling recently at home, states that she fell in the kitchen the other day.  Pt. States no injury, husband concerned that she will fall and injury herself.

## 2011-06-04 NOTE — ED Notes (Signed)
Pt states "I'm losing my license, I have a hx of anxiety, Dr. Leonides Sake, my primary physician tx's me for anxiety"; pt is not tearful @ present

## 2011-06-04 NOTE — ED Notes (Signed)
Pt. Given sandwich/drink at 1145, ate 100%

## 2011-06-04 NOTE — ED Notes (Signed)
Pt.'s husband at bedside with pt.

## 2011-06-04 NOTE — ED Notes (Signed)
Pt.'s husband called to request pt. Be evaluated for ETOH r/t DUI on 06-02-11 and court date in 1-13, per pt.'s son who is a Emergency planning/management officer.
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
## 2011-06-04 NOTE — ED Provider Notes (Signed)
Pt has been evaluated by ACT, do not feel that she needs inpatient tx.  No SI/HI.  Has contracted for safety.  Has been given resources for outpt follow up  Rolan Bucco, MD 06/04/11 2006

## 2011-12-21 ENCOUNTER — Telehealth: Payer: Self-pay | Admitting: Cardiology

## 2011-12-21 NOTE — Telephone Encounter (Signed)
New msg Pt wanted to talk to you about hospital stay last September. Please call

## 2011-12-21 NOTE — Telephone Encounter (Signed)
Spoke with pt, she wanted to make dr Jens Som aware she has concerns regarding the ER visit and the care she received at [facility] during her 02-2011 admission. She is writing a letter to dispute that visit and care and wanted to make sure dr Jens Som was aware in case he was contacted. Dr Jens Som made aware of call and pt concerns.

## 2012-07-02 ENCOUNTER — Emergency Department (HOSPITAL_COMMUNITY): Payer: Medicare Other

## 2012-07-02 ENCOUNTER — Emergency Department (HOSPITAL_COMMUNITY)
Admission: EM | Admit: 2012-07-02 | Discharge: 2012-07-02 | Disposition: A | Discharge status: Home / Self Care | Payer: Medicare Other | Attending: Emergency Medicine | Admitting: Emergency Medicine

## 2012-07-02 ENCOUNTER — Encounter (HOSPITAL_COMMUNITY): Payer: Self-pay

## 2012-07-02 DIAGNOSIS — F17200 Nicotine dependence, unspecified, uncomplicated: Secondary | ICD-10-CM | POA: Insufficient documentation

## 2012-07-02 DIAGNOSIS — I10 Essential (primary) hypertension: Secondary | ICD-10-CM | POA: Insufficient documentation

## 2012-07-02 DIAGNOSIS — R079 Chest pain, unspecified: Secondary | ICD-10-CM

## 2012-07-02 DIAGNOSIS — F411 Generalized anxiety disorder: Secondary | ICD-10-CM | POA: Insufficient documentation

## 2012-07-02 DIAGNOSIS — Z79899 Other long term (current) drug therapy: Secondary | ICD-10-CM | POA: Insufficient documentation

## 2012-07-02 DIAGNOSIS — M545 Low back pain, unspecified: Secondary | ICD-10-CM | POA: Insufficient documentation

## 2012-07-02 DIAGNOSIS — F419 Anxiety disorder, unspecified: Secondary | ICD-10-CM

## 2012-07-02 DIAGNOSIS — G8929 Other chronic pain: Secondary | ICD-10-CM | POA: Insufficient documentation

## 2012-07-02 DIAGNOSIS — R0602 Shortness of breath: Secondary | ICD-10-CM | POA: Insufficient documentation

## 2012-07-02 DIAGNOSIS — R0789 Other chest pain: Secondary | ICD-10-CM | POA: Insufficient documentation

## 2012-07-02 DIAGNOSIS — Z872 Personal history of diseases of the skin and subcutaneous tissue: Secondary | ICD-10-CM | POA: Insufficient documentation

## 2012-07-02 HISTORY — DX: Dermatitis, unspecified: L30.9

## 2012-07-02 LAB — POCT I-STAT, CHEM 8
Creatinine, Ser: 0.9 mg/dL (ref 0.50–1.10)
HCT: 39 % (ref 36.0–46.0)
Hemoglobin: 13.3 g/dL (ref 12.0–15.0)
Potassium: 4.4 mEq/L (ref 3.5–5.1)
Sodium: 143 mEq/L (ref 135–145)
TCO2: 27 mmol/L (ref 0–100)

## 2012-07-02 LAB — POCT I-STAT TROPONIN I: Troponin i, poc: 0 ng/mL (ref 0.00–0.08)

## 2012-07-02 LAB — CBC
MCH: 30.5 pg (ref 26.0–34.0)
MCHC: 33.1 g/dL (ref 30.0–36.0)
Platelets: 261 10*3/uL (ref 150–400)
RDW: 14.4 % (ref 11.5–15.5)

## 2012-07-02 LAB — GLUCOSE, CAPILLARY: Glucose-Capillary: 90 mg/dL (ref 70–99)

## 2012-07-02 MED ORDER — LORAZEPAM 2 MG/ML IJ SOLN
1.0000 mg | Freq: Once | INTRAMUSCULAR | Status: AC
  Administered 2012-07-02: 1 mg via INTRAVENOUS
  Filled 2012-07-02: qty 1

## 2012-07-02 MED ORDER — KETOROLAC TROMETHAMINE 30 MG/ML IJ SOLN
30.0000 mg | Freq: Once | INTRAMUSCULAR | Status: AC
  Administered 2012-07-02: 30 mg via INTRAVENOUS
  Filled 2012-07-02: qty 1

## 2012-07-02 NOTE — ED Notes (Signed)
Pt states she has had back pain and nausea for 2 months.

## 2012-07-02 NOTE — ED Notes (Signed)
Patient transported to CT 

## 2012-07-02 NOTE — ED Notes (Signed)
Per EMS, Pt started having chest pain while sleeping. Pt describing it as a tightness in the left chest. Non radiating. Pt did state she was nauseous, but denied medicine until "we figured out what was wrong with her." On arrival to scene paramedics describe scene as anxiety attack with rapid breathing and hard to get control of breathing. Pt does have hx of anxiety. Pt states it does feel like panic attack. Pt states she was worried because it was worse than normal and thought it was cardiac related. Pt has taken 324mg  of aspirin. Pt has not had nitro. Pt was pain free through out transport. 18 G in L hand. A/o x4, pt is still pain free at this moment.

## 2012-07-02 NOTE — ED Provider Notes (Signed)
History     CSN: [id number]  Arrival date & time 07/02/12  0248   First MD Initiated Contact with Patient 07/02/12 0319      Chief Complaint  Patient presents with  . Chest Pain    (Consider location/radiation/quality/duration/timing/severity/associated sxs/prior treatment) HPI Comments: 62 year old female with a history of anxiety and panic attacks and high blood pressure who presents with a complaint of left-sided chest pain and lower back pain.  #1 the chest pain awoke the patient from her sleep this evening, left-sided, heavy and pressure which is gradually improved and almost totally gone away. In fact she is not having any chest pain on the time of evaluation. This was associated with shortness of breath, felt like her prior panic attack and was associated with anxiety. She did not get nauseated, there was no diaphoresis and she has had no swelling in her legs or other risk factors for pulmonary embolism.  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 She does not have any other pathologic signs or symptoms and states that she takes Tylenol with some relief intermittently.  Patient is a 62 y.o. female presenting with chest pain. The history is provided by the patient and the EMS personnel.  Chest Pain     Past Medical History  Diagnosis Date  . Anxiety   . Hypertension   . Anxiety   . Eczema     Past Surgical History  Procedure Date  . Abdominal hysterectomy     No family history on file.  History  Substance Use Topics  . Smoking status: Current Every Day Smoker -- 1.0 packs/day  . Smokeless tobacco: Not on file  . Alcohol Use: Yes     Comment: "I probably drink too much wine"    OB History    Grav Para Term Preterm Abortions TAB SAB Ect Mult Living                  Review of Systems    Cardiovascular: Positive for chest pain.  All other systems reviewed and are negative.    Allergies  Celexa; Haldol; and Oxycodone  Home Medications   Current Outpatient Rx  Name  Route  Sig  Dispense  Refill  . ACETAMINOPHEN 500 MG PO TABS   Oral   Take 1,000 mg by mouth every 6 (six) hours as needed. For pain         . ALPRAZOLAM 1 MG PO TABS   Oral   Take 1 mg by mouth 5 (five) times daily.          Marland Kitchen DIPHENHYDRAMINE HCL 25 MG PO TABS   Oral   Take 25-50 mg by mouth every 6 (six) hours as needed. For itching         . HYDROCORTISONE 1 % EX CREA   Topical   Apply 1 application topically daily as needed. For itching           BP 144/74  Pulse 68  Temp 97.8 F (36.6 C) (Oral)  Resp 20  SpO2 99%  Physical Exam  Nursing note and vitals reviewed. Constitutional: She appears well-developed and well-nourished. No distress.  HENT:  Head: Normocephalic and atraumatic.  Mouth/Throat: Oropharynx is clear and moist. No oropharyngeal exudate.  Eyes: Conjunctivae normal and EOM are  normal. Pupils are equal, round, and reactive to light. Right eye exhibits no discharge. Left eye exhibits no discharge. No scleral icterus.  Neck: Normal range of motion. Neck supple. No JVD present. No thyromegaly present.  Cardiovascular: Normal rate, regular rhythm, normal heart sounds and intact distal pulses.  Exam reveals no gallop and no friction rub.   No murmur heard. Pulmonary/Chest: Effort normal and breath sounds normal. No respiratory distress. She has no wheezes. She has no rales.  Abdominal: Soft. Bowel sounds are normal. She exhibits no distension and no mass. There is no tenderness.  Musculoskeletal: Normal range of motion. She exhibits tenderness ( Tenderness to palpation in the right lower back, no spinal tenderness). She exhibits no edema.  Lymphadenopathy:    She has no cervical adenopathy.  Neurological: She is alert. Coordination normal.       Normal strength and  sensation of the lower extremities, normal speech, cranial nerves III through XII intact  Skin: Skin is warm and dry. No rash noted. No erythema.  Psychiatric: She has a normal mood and affect. Her behavior is normal.    ED Course  Procedures (including critical care time)   Labs Reviewed  GLUCOSE, CAPILLARY  CBC  POCT I-STAT, CHEM 8  POCT I-STAT TROPONIN I  POCT I-STAT TROPONIN I   Dg Chest 2 View  07/02/2012  *RADIOLOGY REPORT*  Clinical Data: Shortness of breath.  Chest pain.  CHEST - 2 VIEW  Comparison: 02/26/2011  Findings: Borderline heart size with normal pulmonary vascularity. Hyperinflation of the lungs.  This may reflect emphysema or asthma. No focal airspace consolidation.  No pneumothorax.  No blunting of costophrenic angles.  Mediastinal contours appear intact.  Tortuous aorta.  Degenerative changes in the spine.  IMPRESSION: Hyperinflation of the lungs suggesting emphysema or asthma.  No evidence of active pulmonary disease.   Original Report Authenticated By: Burman Nieves, M.D.    Dg Lumbar Spine Complete  07/02/2012  *RADIOLOGY REPORT*  Clinical Data: Low back pain.  LUMBAR SPINE - COMPLETE 4+ VIEW  Comparison: None.  Findings: Five lumbar type vertebrae. Slight anterior subluxation of L4 on L5.  Otherwise normal alignment of the lumbar vertebrae and facet joints.  Mild endplate hypertrophic changes in the mid and lower thoracic spine. No vertebral compression deformities. Intervertebral disc space heights are preserved.  No focal bone lesion or bone destruction.  Bone cortex and trabecular architecture appear intact.  IMPRESSION: Slight anterior subluxation of L4 on L5 likely due to degenerative change.  No displaced fractures identified.   Original Report Authenticated By: Burman Nieves, M.D.      1. Chest pain   2. Anxiety       MDM  The patient is not have any red flags for pathologic back pain, I suspect this is strain of the lumbosacral area and chronic pain.  There is no neurologic deficits. Her chest x-ray has been ordered, laboratory work, EKG shows left axis deviation with poor R-wave progression but no signs of acute ischemia and is unchanged from a prior EKG from 06/04/2011.   ED ECG REPORT  I personally interpreted this EKG   Date: 07/02/2012   Rate: 76  Rhythm: normal sinus rhythm  QRS Axis: left  Intervals: normal  ST/T Wave abnormalities: normal  Conduction Disutrbances:none  Narrative Interpretation: Poor R-wave progression  Old EKG Reviewed: unchanged and Compared with 06/04/2011  ED ECG REPORT  I personally interpreted this EKG   Date: 07/02/2012  0507 AM  Rate: 67  Rhythm: normal sinus rhythm  QRS Axis: left  Intervals: normal  ST/T Wave abnormalities: normal  Conduction Disutrbances:none  Narrative Interpretation: PRWP  Old EKG Reviewed: unchanged  Second troponin normal, repeat testing is normal, pt stable for d/c.  States she felt much better after medicines including ativan - VS with mild hypertension.         Vida Roller, MD 07/02/12 [phone]

## 2012-10-15 ENCOUNTER — Emergency Department (HOSPITAL_COMMUNITY)
Admission: EM | Admit: 2012-10-15 | Discharge: 2012-10-15 | Disposition: A | Discharge status: Home / Self Care | Payer: Medicare Other | Attending: Emergency Medicine | Admitting: Emergency Medicine

## 2012-10-15 ENCOUNTER — Encounter (HOSPITAL_COMMUNITY): Payer: Self-pay | Admitting: Emergency Medicine

## 2012-10-15 ENCOUNTER — Emergency Department (HOSPITAL_COMMUNITY): Payer: Medicare Other

## 2012-10-15 DIAGNOSIS — M545 Low back pain, unspecified: Secondary | ICD-10-CM | POA: Insufficient documentation

## 2012-10-15 DIAGNOSIS — G8929 Other chronic pain: Secondary | ICD-10-CM | POA: Insufficient documentation

## 2012-10-15 DIAGNOSIS — R52 Pain, unspecified: Secondary | ICD-10-CM | POA: Insufficient documentation

## 2012-10-15 DIAGNOSIS — L259 Unspecified contact dermatitis, unspecified cause: Secondary | ICD-10-CM | POA: Insufficient documentation

## 2012-10-15 DIAGNOSIS — M549 Dorsalgia, unspecified: Secondary | ICD-10-CM

## 2012-10-15 DIAGNOSIS — L309 Dermatitis, unspecified: Secondary | ICD-10-CM

## 2012-10-15 DIAGNOSIS — F17200 Nicotine dependence, unspecified, uncomplicated: Secondary | ICD-10-CM | POA: Insufficient documentation

## 2012-10-15 DIAGNOSIS — F411 Generalized anxiety disorder: Secondary | ICD-10-CM | POA: Insufficient documentation

## 2012-10-15 DIAGNOSIS — Z79899 Other long term (current) drug therapy: Secondary | ICD-10-CM | POA: Insufficient documentation

## 2012-10-15 DIAGNOSIS — I10 Essential (primary) hypertension: Secondary | ICD-10-CM | POA: Insufficient documentation

## 2012-10-15 LAB — URINALYSIS, ROUTINE W REFLEX MICROSCOPIC
Nitrite: NEGATIVE
Specific Gravity, Urine: 1.009 (ref 1.005–1.030)
Urobilinogen, UA: 0.2 mg/dL (ref 0.0–1.0)

## 2012-10-15 MED ORDER — HYDROMORPHONE HCL PF 1 MG/ML IJ SOLN
1.0000 mg | Freq: Once | INTRAMUSCULAR | Status: AC
  Administered 2012-10-15: 1 mg via INTRAMUSCULAR
  Filled 2012-10-15: qty 1

## 2012-10-15 MED ORDER — ONDANSETRON HCL 4 MG/2ML IJ SOLN
4.0000 mg | Freq: Once | INTRAMUSCULAR | Status: AC
  Administered 2012-10-15: 4 mg via INTRAMUSCULAR
  Filled 2012-10-15: qty 2

## 2012-10-15 MED ORDER — TRIAMCINOLONE ACETONIDE 0.5 % EX OINT: TOPICAL_OINTMENT | Freq: Two times a day (BID) | CUTANEOUS | Status: AC

## 2012-10-15 NOTE — ED Notes (Signed)
Pt states that she has worked at the same place for the past few years and she has been sitting at her job for a majority of those years. Pt back and lumbar region are hurting her, pt ambulatory, pt denies numbness or tingling.

## 2012-10-15 NOTE — ED Notes (Signed)
PT. REPORTS CHRONIC RIGHT LOWER BACK PAIN FOR 6 MONTHS GOT WORSE THESE PAST SEVERAL DAYS DENIES RECENT FALL OR INJURY UNRELIEVED BY PRESCRIPTION PAIN MEDICATIONS, AMBULATORY.

## 2012-10-15 NOTE — ED Provider Notes (Signed)
History    This chart was scribed for non-physician practitioner Garlon Hatchet working with Richardean Canal, MD by Donne Anon, ED Scribe. This patient was seen in room TR11C/TR11C and the patient's care was started at 2048.   CSN: [id number]  Arrival date & time 10/15/12  1191   First MD Initiated Contact with Patient 10/15/12 2048      Chief Complaint  Patient presents with  . Back Pain     The history is provided by the patient. No language interpreter was used.   Ariana Gilbert is a 62 y.o. female who presents to the Emergency Department complaining of gradual onset, chronic severe right lower back pain which began 6 months ago and got worse over the past few days. She states that the pain worse when she is standing. She has denies any recent trauma or fall. Notes the pain became severe after attempting to do sit-ups earlier this morning.  She has tried Tramadol, Hydrocortisone, and Oxycodone without relief. Has a previously scheduled appointment with [facility] tomorrow for evaluation of this pain.  Denies any urinary frequency, dysuria, or hematuria.  Denies any numbness or paresthesias of LE. No loss of bowel or bladder function. She also states that she has dry skin over her lower legs and palms of her hands which she thinks might be eczema.   Past Medical History  Diagnosis Date  . Anxiety   . Hypertension   . Anxiety   . Eczema     Past Surgical History  Procedure Laterality Date  . Abdominal hysterectomy      No family history on file.  History  Substance Use Topics  . Smoking status: Current Every Day Smoker -- 1.00 packs/day  . Smokeless tobacco: Not on file  . Alcohol Use: Yes     Comment: "I probably drink too much wine"     Review of Systems  Musculoskeletal: Positive for back pain.  All other systems reviewed and are negative.    Allergies  Celexa; Haldol; Morphine and related; Oxycodone; and Percocet  Home Medications   Current  Outpatient Rx  Name  Route  Sig  Dispense  Refill  . ALPRAZolam (XANAX) 1 MG tablet   Oral   Take 1 mg by mouth 5 (five) times daily.          . diphenhydrAMINE (BENADRYL) 25 MG tablet   Oral   Take 25-50 mg by mouth every 6 (six) hours as needed. For itching         . hydrocortisone cream 1 %   Topical   Apply 1 application topically daily as needed. For itching         . traMADol (ULTRAM) 50 MG tablet   Oral   Take 100 mg by mouth every 6 (six) hours as needed for pain. For pain           BP 161/94  Pulse 101  Temp(Src) 98.2 F (36.8 C) (Oral)  Resp 20  SpO2 96%  Physical Exam  Nursing note and vitals reviewed. Constitutional: She is oriented to person, place, and time. She appears well-developed and well-nourished.  HENT:  Head: Normocephalic and atraumatic.  Eyes: Conjunctivae and EOM are normal.  Neck: Normal range of motion. Neck supple.  Cardiovascular: Normal rate, regular rhythm and normal heart sounds.   Pulmonary/Chest: Effort normal and breath sounds normal.  Musculoskeletal: Normal range of motion.       Lumbar back: She exhibits tenderness, pain and  spasm. She exhibits normal range of motion and no bony tenderness.       Back:  TTP of right lower back without associated swelling, bruising, or deformity, distal sensation intact  Neurological: She is alert and oriented to person, place, and time.  Skin: Skin is warm and dry.  Eczematous rash of lower legs and hands  Psychiatric: She has a normal mood and affect.    ED Course  Procedures (including critical care time) DIAGNOSTIC STUDIES: Oxygen Saturation is 96% on room air, adequate by my interpretation.    COORDINATION OF CARE: 9:09 PM Discussed treatment plan which includes pain medication and nausea medication with pt at bedside and pt agreed to plan. Will also provide a cream for eczema.     Labs Reviewed  URINALYSIS, ROUTINE W REFLEX MICROSCOPIC   Dg Lumbar Spine  Complete  10/15/2012  *RADIOLOGY REPORT*  Clinical Data: Low back pain.  No known injury.  LUMBAR SPINE - COMPLETE 4+ VIEW  Comparison: 07/02/2012  Findings: Diffuse degenerative disc and facet disease.  Slight anterolisthesis of L4 on L5 is stable, likely related to facet disease.  Slight anterolisthesis of L5-S1 also stable and related to facet disease.  No change.  No acute findings.  No fracture.  SI joints are symmetric and unremarkable.  IMPRESSION: Degenerative disc and facet disease as above.  No acute findings or change.   Original Report Authenticated By: Charlett Nose, M.D.      1. Back pain   2. Eczema       MDM   Patient presented to the ED for acute exacerbation of chronic back pain.  X-ray negative. Pt also notes eczema flare of lower legs and hands.  U/a negative for infection.  Dilaudid and zofran given in the ED with relief.  Rx triamcinolone cream.  Pt has previously scheduled FU appt with orthopedic tomorrow- she will keep this appt.  Continue taking home pain meds as needed.  Discussed plan with pt- she agreed.  Return precautions advised.  I personally performed the services described in this documentation, which was scribed in my presence. The recorded information has been reviewed and is accurate.       Garlon Hatchet, PA-C 10/16/12 0001

## 2012-10-16 NOTE — ED Provider Notes (Signed)
Medical screening examination/treatment/procedure(s) were performed by non-physician practitioner and as supervising physician I was immediately available for consultation/collaboration.   Richardean Canal, MD 10/16/12 [phone]

## 2012-10-18 ENCOUNTER — Emergency Department (HOSPITAL_COMMUNITY)
Admission: EM | Admit: 2012-10-18 | Discharge: 2012-10-18 | Disposition: A | Discharge status: Home / Self Care | Payer: Medicare Other | Attending: Emergency Medicine | Admitting: Emergency Medicine

## 2012-10-18 ENCOUNTER — Encounter (HOSPITAL_COMMUNITY): Payer: Self-pay | Admitting: Emergency Medicine

## 2012-10-18 DIAGNOSIS — Z872 Personal history of diseases of the skin and subcutaneous tissue: Secondary | ICD-10-CM | POA: Insufficient documentation

## 2012-10-18 DIAGNOSIS — M545 Low back pain, unspecified: Secondary | ICD-10-CM | POA: Insufficient documentation

## 2012-10-18 DIAGNOSIS — F17200 Nicotine dependence, unspecified, uncomplicated: Secondary | ICD-10-CM | POA: Insufficient documentation

## 2012-10-18 DIAGNOSIS — M549 Dorsalgia, unspecified: Secondary | ICD-10-CM

## 2012-10-18 DIAGNOSIS — I10 Essential (primary) hypertension: Secondary | ICD-10-CM | POA: Insufficient documentation

## 2012-10-18 DIAGNOSIS — Z8659 Personal history of other mental and behavioral disorders: Secondary | ICD-10-CM | POA: Insufficient documentation

## 2012-10-18 DIAGNOSIS — G8929 Other chronic pain: Secondary | ICD-10-CM | POA: Insufficient documentation

## 2012-10-18 DIAGNOSIS — Z79899 Other long term (current) drug therapy: Secondary | ICD-10-CM | POA: Insufficient documentation

## 2012-10-18 MED ORDER — ONDANSETRON 4 MG PO TBDP
8.0000 mg | ORAL_TABLET | Freq: Once | ORAL | Status: AC
  Administered 2012-10-18: 8 mg via ORAL
  Filled 2012-10-18: qty 2

## 2012-10-18 MED ORDER — HYDROMORPHONE HCL PF 1 MG/ML IJ SOLN
1.0000 mg | Freq: Once | INTRAMUSCULAR | Status: AC
  Administered 2012-10-18: 1 mg via INTRAMUSCULAR
  Filled 2012-10-18: qty 1

## 2012-10-18 NOTE — ED Provider Notes (Signed)
  Medical screening examination/treatment/procedure(s) were performed by non-physician practitioner and as supervising physician I was immediately available for consultation/collaboration.    Arlanda Shiplett D Megin Consalvo, MD 10/18/12 2345 

## 2012-10-18 NOTE — ED Notes (Signed)
C/o lower back pain.  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  States she was seen in the ED on Sunday and given a shot of Dilaudid for pain but did not get a Rx to take home.  Husband reports they called orthopedic and was told to come to ED.

## 2012-10-18 NOTE — ED Notes (Signed)
Asked pt to change into gown and she refused.  Husband states she did not have to change on Sunday and that she has already had test done.  Explained the reasoning MD would like pt in gown and she declined.

## 2012-10-18 NOTE — ED Provider Notes (Signed)
History    This chart was scribed for non-physician practitioner working with Vida Roller, MD by Smitty Pluck, ED scribe. This patient was seen in room TR10C/TR10C and the patient's care was started at 7:00 PM.   CSN: [id number]  Arrival date & time 10/18/12  1803   First MD Initiated Contact with Patient 10/18/12 1813      Chief Complaint  Patient presents with  . Back Pain    The history is provided by the patient and medical records. No language interpreter was used.   Ariana Gilbert is a 62 y.o. female who presents to the Emergency Department with chief complaint of constant severe back pain that began about one week ago.   Pt was seen 3 days ago in the ED and treated by Dr. Silverio Lay with Dilaudid and an anti-nausea medication yielding temporary relief, but pain returned.  Pt will be seen at [facility] in one day for an injection and therapy.  Pt has used multiple OTC and prescription for pain.  Pt claims history of spinal injury with nervous damage and states she is having trouble sleeping.  Pt denies urinary and bowel incontinence, fever, chills, nausea, vomiting, diarrhea, weakness, cough, SOB and any other pain.  Pt has a history of smoking.  Pt has a h/o anxiety and uses Xanax.  Pt states some narcotics cause vomiting and allergy to morphine and vicodin.  Past Medical History  Diagnosis Date  . Anxiety   . Hypertension   . Anxiety   . Eczema     Past Surgical History  Procedure Laterality Date  . Abdominal hysterectomy      No family history on file.  History  Substance Use Topics  . Smoking status: Current Every Day Smoker -- 1.00 packs/day  . Smokeless tobacco: Not on file  . Alcohol Use: Yes     Comment: "I probably drink too much wine"    OB History   Grav Para Term Preterm Abortions TAB SAB Ect Mult Living                  Review of Systems 10 Systems reviewed and all are negative for acute change except as noted in the HPI.   Allergies   Celexa; Haldol; Hydrocortisone; Morphine and related; Oxycodone; Percocet; Sulfa antibiotics; and Trazodone and nefazodone  Home Medications   Current Outpatient Rx  Name  Route  Sig  Dispense  Refill  . ALPRAZolam (XANAX) 1 MG tablet   Oral   Take 1 mg by mouth 5 (five) times daily.          . cyclobenzaprine (FLEXERIL) 10 MG tablet   Oral   Take 10 mg by mouth 3 (three) times daily as needed for muscle spasms.         Marland Kitchen dextromethorphan (DELSYM) 30 MG/5ML liquid   Oral   Take 60 mg by mouth as needed for cough.         . diphenhydrAMINE (BENADRYL) 25 MG tablet   Oral   Take 25-50 mg by mouth every 6 (six) hours as needed. For itching         . feeding supplement (BOOST HIGH PROTEIN) LIQD   Oral   Take 1 Container by mouth 3 (three) times daily between meals.         Marland Kitchen ibuprofen (ADVIL,MOTRIN) 200 MG tablet   Oral   Take 800 mg by mouth every 6 (six) hours as needed for pain.         Marland Kitchen  naphazoline-pheniramine (NAPHCON-A) 0.025-0.3 % ophthalmic solution   Both Eyes   Place 2 drops into both eyes 4 (four) times daily as needed (for dry itchy eyes).         . traMADol (ULTRAM) 50 MG tablet   Oral   Take 100 mg by mouth every 6 (six) hours as needed for pain. For pain         . triamcinolone ointment (KENALOG) 0.5 %   Topical   Apply topically 2 (two) times daily. Do not apply to face.   30 g   0     BP 136/81  Pulse 98  Temp(Src) 98.4 F (36.9 C) (Oral)  Resp 18  SpO2 98%  Physical Exam  Nursing note and vitals reviewed. Constitutional: She is oriented to person, place, and time. She appears well-developed and well-nourished. No distress.  HENT:  Head: Normocephalic and atraumatic.  Eyes: Conjunctivae and EOM are normal. Right eye exhibits no discharge. Left eye exhibits no discharge. No scleral icterus.  Neck: Normal range of motion. Neck supple. No tracheal deviation present.  Cardiovascular: Normal rate, regular rhythm and normal heart  sounds.  Exam reveals no gallop and no friction rub.   No murmur heard. Pulmonary/Chest: Effort normal and breath sounds normal. No respiratory distress. She has no wheezes.  Abdominal: Soft. She exhibits no distension. There is no tenderness.  Musculoskeletal: Normal range of motion.  Lumbar paraspinal muscles tender to palpation, no bony tenderness, step-offs, or gross abnormality or deformity of spine, patient is able to ambulate, moves all extremities  Neurological: She is alert and oriented to person, place, and time.  Sensation and strength intact bilaterally  Skin: Skin is warm and dry. She is not diaphoretic.  Psychiatric: She has a normal mood and affect. Her behavior is normal. Judgment and thought content normal.    ED Course  Procedures DIAGNOSTIC STUDIES: Oxygen Saturation is 98% on room air, normal by my interpretation.    COORDINATION OF CARE: 7:12 PM Discussed ED treatment with pt including a shot of Dilaudid, zofran, and icing area at home and pt agrees.    Labs Reviewed - No data to display No results found.   1. Back pain   2. Chronic pain       MDM  MDM Number of Diagnoses or Management Options Back pain:  Chronic pain:   Patient with back pain.  No neurological deficits and normal neuro exam.  Patient can walk but states is painful.  No loss of bowel or bladder control.  No concern for cauda equina.  No fever, night sweats, weight loss, h/o cancer, IVDU.  RICE protocol and pain medicine indicated and discussed with patient.        I personally performed the services described in this documentation, which was scribed in my presence. The recorded information has been reviewed and is accurate.     Roxy Horseman, PA-C 10/18/12 2044

## 2013-04-19 ENCOUNTER — Other Ambulatory Visit: Payer: Self-pay

## 2014-03-17 ENCOUNTER — Encounter (HOSPITAL_COMMUNITY): Payer: Self-pay | Admitting: Emergency Medicine

## 2014-03-17 ENCOUNTER — Emergency Department (HOSPITAL_COMMUNITY)
Admission: EM | Admit: 2014-03-17 | Discharge: 2014-03-17 | Disposition: A | Discharge status: Home / Self Care | Payer: Medicare Other | Attending: Emergency Medicine | Admitting: Emergency Medicine

## 2014-03-17 DIAGNOSIS — I10 Essential (primary) hypertension: Secondary | ICD-10-CM | POA: Insufficient documentation

## 2014-03-17 DIAGNOSIS — M545 Low back pain: Secondary | ICD-10-CM | POA: Diagnosis present

## 2014-03-17 DIAGNOSIS — F419 Anxiety disorder, unspecified: Secondary | ICD-10-CM | POA: Insufficient documentation

## 2014-03-17 DIAGNOSIS — G8929 Other chronic pain: Secondary | ICD-10-CM | POA: Diagnosis not present

## 2014-03-17 DIAGNOSIS — Z79899 Other long term (current) drug therapy: Secondary | ICD-10-CM | POA: Diagnosis not present

## 2014-03-17 DIAGNOSIS — Z872 Personal history of diseases of the skin and subcutaneous tissue: Secondary | ICD-10-CM | POA: Insufficient documentation

## 2014-03-17 DIAGNOSIS — Z720 Tobacco use: Secondary | ICD-10-CM | POA: Diagnosis not present

## 2014-03-17 DIAGNOSIS — M549 Dorsalgia, unspecified: Secondary | ICD-10-CM

## 2014-03-17 MED ORDER — TRAMADOL HCL 50 MG PO TABS: 50.0000 mg | ORAL_TABLET | Freq: Four times a day (QID) | ORAL | Status: AC | PRN

## 2014-03-17 NOTE — Discharge Instructions (Signed)
Chronic Back Pain  When back pain lasts longer than 3 months, it is called chronic back pain.People with chronic back pain often go through certain periods that are more intense (flare-ups).  CAUSES Chronic back pain can be caused by wear and tear (degeneration) on different structures in your back. These structures include:  The bones of your spine (vertebrae) and the joints surrounding your spinal cord and nerve roots (facets).  The strong, fibrous tissues that connect your vertebrae (ligaments). Degeneration of these structures may result in pressure on your nerves. This can lead to constant pain. HOME CARE INSTRUCTIONS  Avoid bending, heavy lifting, prolonged sitting, and activities which make the problem worse.  Take brief periods of rest throughout the day to reduce your pain. Lying down or standing usually is better than sitting while you are resting.  Take over-the-counter or prescription medicines only as directed by your caregiver. SEEK IMMEDIATE MEDICAL CARE IF:   You have weakness or numbness in one of your legs or feet.  You have trouble controlling your bladder or bowels.  You have nausea, vomiting, abdominal pain, shortness of breath, or fainting. Document Released: 07/08/2004 Document Revised: 08/23/2011 Document Reviewed: 05/15/2011 [facility] Patient Information 2015 [place], [place]. This information is not intended to replace advice given to you by your health care provider. Make sure you discuss any questions you have with your health care provider.  RESOURCE GUIDE  Chronic Pain Problems: Contact [facility]  [phone] Patients need to be referred by their primary care doctor.  Insufficient Money for Medicine: Contact [employer]:  call "211."   No Primary Care Doctor: - Call [facility]  [phone] - can help you locate a primary care doctor that  accepts your insurance, provides certain services, etc. - Physician Referral Service-  [phone]  Agencies that provide inexpensive medical care: - [facility]  [phone] - [facility]  [phone] - [facility]  [phone] - [facility] Clinic  [phone] - [facility]  [phone] [facility]  [phone]  Medicaid-accepting [facility] Providers: - [facility]- [address]  [phone], Mon-Fri 9am-7pm, Sat 9am-1pm - [facility]- [address] [place], [address] [place]  [phone] - [facility]- [address], [address] [place]  [phone] [facility]- [address]  [phone] - Renaye Rakers- [address] [place], [address], [phone]  Only accepts [place] patients after they have their name  applied to their card  Self Pay (no insurance) in [place]: - Sickle Cell Patients: Dr Willey Blade, [facility]  [address] [place], [phone] - [facility]- [address] [place]  [phone]       [facility]- [address]       -     [facility]- see information above (Speak to [employer] if you do not have insurance)       -  [facility]- [address],  [phone]       -  [facility]- [address], [phone]       -  Dr Julio Sicks-  [address], [place], [phone]       -  [facility] - [address], [phone]       -  [facility] [place]- [address], [phone], also [address], [phone]       -    [facility]- [address] [place], [phone], 1st & 3rd Saturday        every month, 10am-1pm  1) Find a Doctor and Pay Out of Pocket Although you won't have to find out who is covered by your insurance plan, it is a good idea to ask around and get recommendations. You will then need to call the office and see if the doctor you have chosen will  accept you as a new patient and what types of options they offer for patients who are self-pay. Some doctors offer discounts or will set up payment plans for their patients who do not have insurance, but you will need to ask so you aren't surprised when you get to your appointment.  2) Contact Your Local Health Department Not all health departments have doctors that can see patients for sick visits, but many do, so it is worth a call to see if yours does. If you don't know where your local health department is, you can check in your phone book. The CDC also has a tool to help you locate your state's health department, and many state websites also have listings of all of their local health departments.  3) Find a Walk-in Clinic If your illness is not likely to be very severe or complicated, you may want to try a walk in clinic. These are popping up all over the country in pharmacies, drugstores, and shopping centers. They're usually staffed by nurse practitioners or physician assistants that have been trained to treat common illnesses and complaints. They're usually fairly quick and inexpensive. However, if you have serious medical issues or chronic medical problems, these are probably not your best option

## 2014-03-17 NOTE — ED Provider Notes (Signed)
CSN: [id number]     Arrival date & time 03/17/14  [id number] History  This chart was scribed for non-physician practitioner, Johnnette Gourdobyn Albert, PA-C,working with Flint MelterElliott L Wentz, MD, by Karle PlumberJennifer Tensley, ED Scribe. This patient was seen in room TR08C/TR08C and the patient's care was started at 10:03 AM.  Chief Complaint  Patient presents with  . Back Pain   The history is provided by the patient. No language interpreter was used.   HPI Comments:  Ariana Gilbert is a 63 y.o. female with PMH of anxiety and HTN who presents to the Emergency Department complaining of chronic back pain that worsened today upon waking this morning. She states she could hardly get out of the bed. Pt states she has been taking Flexeril with significant relief of the pain but states it "wears off". Her PCP has given her "injections" in her back but states he will not do it anymore and she needed to see a specialist to manage her chronic pain. She denies any new injury, trauma or fall. Pt denies bowel or bladder incontinence, numbness, weakness or tingling of the lower extremities. She states she has an [facility] two [facility].  Past Medical History  Diagnosis Date  . Anxiety   . Hypertension   . Anxiety   . Eczema    Past Surgical History  Procedure Laterality Date  . Abdominal hysterectomy     No family history on file. History  Substance Use Topics  . Smoking status: Current Every Day Smoker -- 1.00 packs/day  . Smokeless tobacco: Not on file  . Alcohol Use: Yes     Comment: "I probably drink too much wine"   OB History   Grav Para Term Preterm Abortions TAB SAB Ect Mult Living                 Review of Systems  Musculoskeletal: Positive for back pain.  Neurological: Negative for weakness and numbness.  All other systems reviewed and are negative.   Allergies  Celexa; Haldol; Hydrocortisone; Morphine and related; Oxycodone; Percocet; Sulfa antibiotics; and Trazodone  and nefazodone  Home Medications   Prior to Admission medications   Medication Sig Start Date End Date Taking? Authorizing Provider  ALPRAZolam Prudy Feeler(XANAX) 1 MG tablet Take 1 mg by mouth 5 (five) times daily.     Historical Provider, MD  cyclobenzaprine (FLEXERIL) 10 MG tablet Take 10 mg by mouth 3 (three) times daily as needed for muscle spasms.    Historical Provider, MD  dextromethorphan (DELSYM) 30 MG/5ML liquid Take 60 mg by mouth as needed for cough.    Historical Provider, MD  diphenhydrAMINE (BENADRYL) 25 MG tablet Take 25-50 mg by mouth every 6 (six) hours as needed. For itching    Historical Provider, MD  feeding supplement (BOOST HIGH PROTEIN) LIQD Take 1 Container by mouth 3 (three) times daily between meals.    Historical Provider, MD  ibuprofen (ADVIL,MOTRIN) 200 MG tablet Take 800 mg by mouth every 6 (six) hours as needed for pain.    Historical Provider, MD  naphazoline-pheniramine (NAPHCON-A) 0.025-0.3 % ophthalmic solution Place 2 drops into both eyes 4 (four) times daily as needed (for dry itchy eyes).    Historical Provider, MD  traMADol (ULTRAM) 50 MG tablet Take 100 mg by mouth every 6 (six) hours as needed for pain. For pain    Historical Provider, MD  traMADol (ULTRAM) 50 MG tablet Take 1 tablet (50 mg total) by  mouth every 6 (six) hours as needed. 03/17/14   Trevor Mace, PA-C  triamcinolone ointment (KENALOG) 0.5 % Apply topically 2 (two) times daily. Do not apply to face. 10/15/12   Garlon Hatchet, PA-C   Triage Vitals: BP 111/76  Pulse 94  Temp(Src) 98.3 F (36.8 C)  Resp 17  Ht 5' (1.524 m)  SpO2 97% Physical Exam  Nursing note and vitals reviewed. Constitutional: She is oriented to person, place, and time. She appears well-developed and well-nourished. No distress.  HENT:  Head: Normocephalic and atraumatic.  Mouth/Throat: Oropharynx is clear and moist.  Eyes: Conjunctivae and EOM are normal.  Neck: Normal range of motion. Neck supple.  Cardiovascular: Normal  rate, regular rhythm and normal heart sounds.   Pulmonary/Chest: Effort normal and breath sounds normal. No respiratory distress.  Musculoskeletal: Normal range of motion. She exhibits no edema.  Tender to palpation of lower lumbar spine and right sided paraspinal muscles without spasm.  Neurological: She is alert and oriented to person, place, and time. No sensory deficit.  Normal gait. Strength normal of bilateral lower extremities.  Skin: Skin is warm and dry.  Psychiatric: She has a normal mood and affect. Her behavior is normal.    ED Course  Procedures (including critical care time) DIAGNOSTIC STUDIES: Oxygen Saturation is 97% on RA, normal by my interpretation.   COORDINATION OF CARE: 10:12 AM- Will prescribe tramadol and provide referral to pain clinic. Pt verbalizes understanding and agrees to plan.  Medications - No data to display  Labs Review Labs Reviewed - No data to display  Imaging Review No results found.   EKG Interpretation None      MDM   Final diagnoses:  Chronic back pain   Patient with chronic back pain, she is nontoxic appearing and in no apparent distress. Afebrile, vital signs stable. No red flags concerning patient's back pain. No s/s of central cord compression or cauda equina. Lower extremities are neurovascularly intact and patient is ambulating without difficulty. Her PCP will no longer treat her chronic back pain, I discussed with her that she will need to followup with the back specialist as advised in the past. Will prescribe tramadol. Resources given. Stable for discharge. Return precautions given. Patient states understanding of treatment care plan and is agreeable.  I personally performed the services described in this documentation, which was scribed in my presence. The recorded information has been reviewed and is accurate.    Trevor Mace, PA-C 03/17/14 1016

## 2014-03-17 NOTE — ED Notes (Signed)
Pt. Stated, I've had back pain for 43 years.  I could hardly get out of bed this morning. [facility]

## 2014-03-19 NOTE — ED Provider Notes (Signed)
Medical screening examination/treatment/procedure(s) were performed by non-physician practitioner and as supervising physician I was immediately available for consultation/collaboration.  Flint MelterElliott L Katerine Morua, MD 03/19/14 0030

## 2014-03-29 ENCOUNTER — Other Ambulatory Visit: Payer: Self-pay

## 2014-09-13 IMAGING — CR DG CHEST 2V
2 series · 2 of 2 positions shown · non-contrast
Comparison: 02/26/2011

CLINICAL DATA: Shortness of breath.  Chest pain.

CHEST - 2 VIEW

[w chest pa]
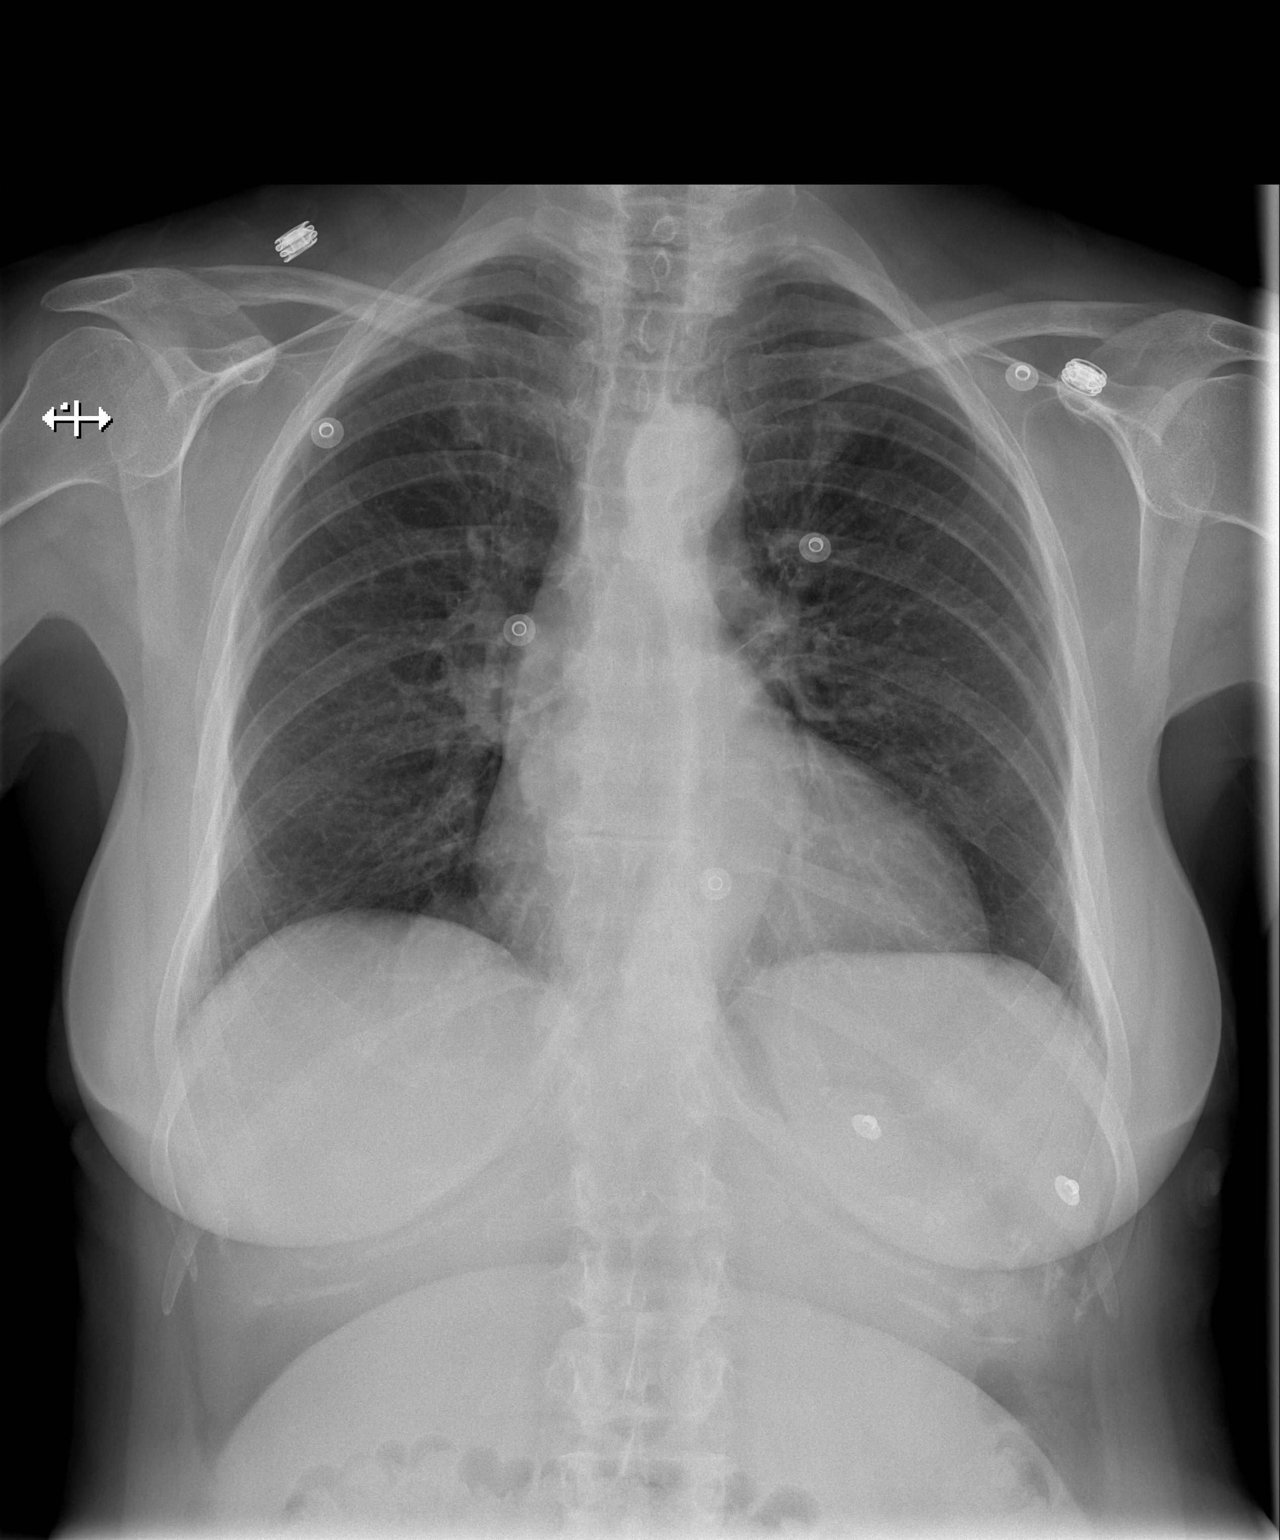

[w chest lat]
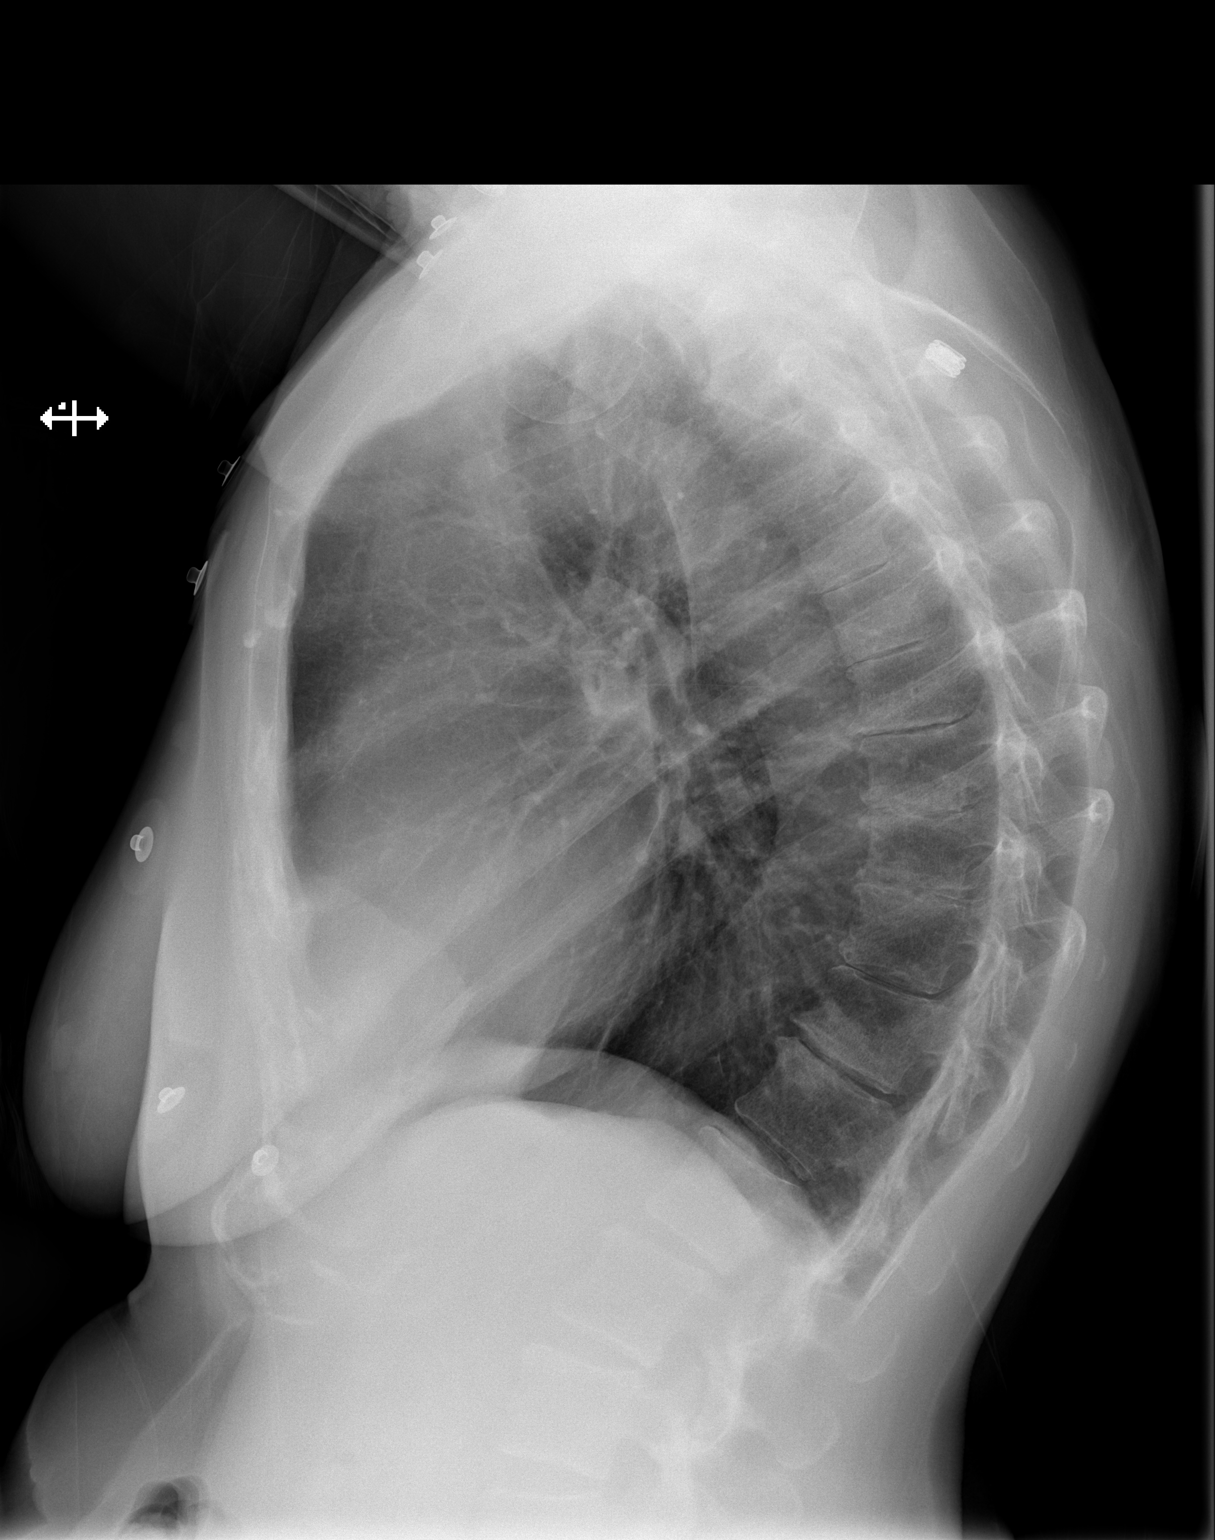

[2 of 2 positions shown; findings below may reference images not displayed]

FINDINGS: Borderline heart size with normal pulmonary vascularity.
Hyperinflation of the lungs.  This may reflect emphysema or asthma.
No focal airspace consolidation.  No pneumothorax.  No blunting of
costophrenic angles.  Mediastinal contours appear intact.  Tortuous
aorta.  Degenerative changes in the spine.
IMPRESSION: Hyperinflation of the lungs suggesting emphysema or asthma.  No
evidence of active pulmonary disease.

## 2015-02-20 ENCOUNTER — Encounter (HOSPITAL_COMMUNITY): Payer: Self-pay | Admitting: Emergency Medicine

## 2015-02-20 ENCOUNTER — Emergency Department (HOSPITAL_COMMUNITY)
Admission: EM | Admit: 2015-02-20 | Discharge: 2015-02-20 | Disposition: A | Discharge status: Home / Self Care | Payer: Medicare Other | Attending: Emergency Medicine | Admitting: Emergency Medicine

## 2015-02-20 DIAGNOSIS — G8929 Other chronic pain: Secondary | ICD-10-CM | POA: Diagnosis not present

## 2015-02-20 DIAGNOSIS — I10 Essential (primary) hypertension: Secondary | ICD-10-CM | POA: Insufficient documentation

## 2015-02-20 DIAGNOSIS — M545 Low back pain, unspecified: Secondary | ICD-10-CM

## 2015-02-20 DIAGNOSIS — F419 Anxiety disorder, unspecified: Secondary | ICD-10-CM | POA: Diagnosis not present

## 2015-02-20 DIAGNOSIS — M546 Pain in thoracic spine: Secondary | ICD-10-CM

## 2015-02-20 DIAGNOSIS — Z79899 Other long term (current) drug therapy: Secondary | ICD-10-CM | POA: Insufficient documentation

## 2015-02-20 DIAGNOSIS — Z7952 Long term (current) use of systemic steroids: Secondary | ICD-10-CM | POA: Insufficient documentation

## 2015-02-20 DIAGNOSIS — Z872 Personal history of diseases of the skin and subcutaneous tissue: Secondary | ICD-10-CM | POA: Insufficient documentation

## 2015-02-20 DIAGNOSIS — Z720 Tobacco use: Secondary | ICD-10-CM | POA: Diagnosis not present

## 2015-02-20 HISTORY — DX: Other chronic pain: G89.29

## 2015-02-20 HISTORY — DX: Low back pain: M54.5

## 2015-02-20 HISTORY — DX: Dorsalgia, unspecified: M54.9

## 2015-02-20 HISTORY — DX: Pain in thoracic spine: M54.6

## 2015-02-20 NOTE — ED Notes (Signed)
Declined W/C at D/C and was escorted to lobby by RN. 

## 2015-02-20 NOTE — ED Provider Notes (Signed)
CSN: [id number]     Arrival date & time 02/20/15  0715 History   First MD Initiated Contact with Patient 02/20/15 [phone]     Chief Complaint  Patient presents with  . Back Pain    HPI   64 year old female presents today with chronic back pain. Patient reports for the last 19 years she's had lower back pain, followed by her primary care followed by her orthopedist. She reports that due to the nature of her back pain she is not a candidate for surgery, injections or not working. She reports that she takes Flexeril, does not take any other over-the-counter pain medication as needed. She reports she attempted using ibuprofen once yesterday with minimal relief of symptoms. She reports pain is worse with leaning forward, prolonged periods of standing or sitting. She reports she does have some radiation of pain down into her right leg with flexion of the head. Patient denies any loss of distal sensation, strength, function. She denies fever, chills, IV drug use, saddle anesthesia, or any other concerning signs or symptoms.  Patient denies abdominal pain, dizziness, fever, chills, cough, chest pain.  Past Medical History  Diagnosis Date  . Anxiety   . Hypertension   . Anxiety   . Eczema   . Chronic mid back pain   . Chronic lower back pain    Past Surgical History  Procedure Laterality Date  . Abdominal hysterectomy     History reviewed. No pertinent family history. Social History  Substance Use Topics  . Smoking status: Current Every Day Smoker -- 1.00 packs/day  . Smokeless tobacco: None  . Alcohol Use: Yes     Comment: "I probably drink too much wine"   OB History    No data available     Review of Systems  All other systems reviewed and are negative.     Allergies  Celexa; Haldol; Hydrocortisone; Morphine and related; Oxycodone; Percocet; Sulfa antibiotics; and Trazodone and nefazodone  Home Medications   Prior to Admission medications   Medication Sig Start Date End Date  Taking? Authorizing Provider  ALPRAZolam Prudy Feeler) 1 MG tablet Take 1 mg by mouth 5 (five) times daily.     Historical Provider, MD  cyclobenzaprine (FLEXERIL) 10 MG tablet Take 10 mg by mouth 3 (three) times daily as needed for muscle spasms.    Historical Provider, MD  dextromethorphan (DELSYM) 30 MG/5ML liquid Take 60 mg by mouth as needed for cough.    Historical Provider, MD  diphenhydrAMINE (BENADRYL) 25 MG tablet Take 25-50 mg by mouth every 6 (six) hours as needed. For itching    Historical Provider, MD  feeding supplement (BOOST HIGH PROTEIN) LIQD Take 1 Container by mouth 3 (three) times daily between meals.    Historical Provider, MD  ibuprofen (ADVIL,MOTRIN) 200 MG tablet Take 800 mg by mouth every 6 (six) hours as needed for pain.    Historical Provider, MD  naphazoline-pheniramine (NAPHCON-A) 0.025-0.3 % ophthalmic solution Place 2 drops into both eyes 4 (four) times daily as needed (for dry itchy eyes).    Historical Provider, MD  traMADol (ULTRAM) 50 MG tablet Take 100 mg by mouth every 6 (six) hours as needed for pain. For pain    Historical Provider, MD  traMADol (ULTRAM) 50 MG tablet Take 1 tablet (50 mg total) by mouth every 6 (six) hours as needed. 03/17/14   Kathrynn Speed, PA-C  triamcinolone ointment (KENALOG) 0.5 % Apply topically 2 (two) times daily. Do not apply to face.  10/15/12   Garlon Hatchet, PA-C   BP 163/77 mmHg  Pulse 90  Temp(Src) 97.4 F (36.3 C) (Oral)  Resp 18  SpO2 100%   Physical Exam  Constitutional: She is oriented to person, place, and time. She appears well-developed and well-nourished. No distress.  HENT:  Head: Normocephalic and atraumatic.  Eyes: Conjunctivae are normal. Pupils are equal, round, and reactive to light. Right eye exhibits no discharge. Left eye exhibits no discharge. No scleral icterus.  Neck: Normal range of motion. Neck supple. No JVD present. No tracheal deviation present.  Pulmonary/Chest: Effort normal. No stridor.   Musculoskeletal: Normal range of motion. She exhibits tenderness. She exhibits no edema.  No C, T, or L spine tenderness to palpation. No obvious signs of trauma, deformity, infection, step-offs. Lung expansion normal. No scoliosis or kyphosis. Bilateral lower extremity strength 5 out of 5, sensation grossly intact, patellar reflexes 2+, pedal pulses 2+, Refill less than 3 seconds. Right lower back pain with hip flexion.  Straight leg negative Ambulates without difficulty   Neurological: She is alert and oriented to person, place, and time. Coordination normal.  Skin: Skin is warm and dry. She is not diaphoretic.  Psychiatric: She has a normal mood and affect. Her behavior is normal. Judgment and thought content normal.  Nursing note and vitals reviewed.   ED Course  Procedures (including critical care time) Labs Review Labs Reviewed - No data to display  Imaging Review No results found. I have personally reviewed and evaluated these images and lab results as part of my medical decision-making.   EKG Interpretation None      MDM   Final diagnoses:  Chronic lower back pain    Labs:  Imaging: None indicated  Consults:  Therapeutics:  Discharge Meds:   Assessment/Plan: Patient presents with chronic back pain, she is not currently taking any pain medications. Patient is advised to contact her primary care provider for management of her chronic issues. She is instructed contact her orthopedist for further evaluation and management of her chronic back pain. Patient is given strict return precautions, verbalized understanding and agreement for today's plan. Patient has no red flags for back pain.          Eyvonne Mechanic, PA-C 02/20/15 1610  Eyvonne Mechanic, PA-C 02/20/15 1630  Bethann Berkshire, MD 02/21/15 [phone]

## 2015-02-20 NOTE — ED Notes (Addendum)
Pt reports Leonides Sake is PT's PCP and writes the RX for xanax and flexeril . PT is moving to [facility] area and Pt has been advised to seek advise from new MD. Pt reports the PCP will  Not write any other MED. Scripts.

## 2015-02-20 NOTE — Discharge Instructions (Signed)
Back Exercises Back exercises help treat and prevent back injuries. The goal of back exercises is to increase the strength of your abdominal and back muscles and the flexibility of your back. These exercises should be started when you no longer have back pain. Back exercises include:  Pelvic Tilt. Lie on your back with your knees bent. Tilt your pelvis until the lower part of your back is against the floor. Hold this position 5 to 10 sec and repeat 5 to 10 times.  Knee to Chest. Pull first 1 knee up against your chest and hold for 20 to 30 seconds, repeat this with the other knee, and then both knees. This may be done with the other leg straight or bent, whichever feels better.  Sit-Ups or Curl-Ups. Bend your knees 90 degrees. Start with tilting your pelvis, and do a partial, slow sit-up, lifting your trunk only 30 to 45 degrees off the floor. Take at least 2 to 3 seconds for each sit-up. Do not do sit-ups with your knees out straight. If partial sit-ups are difficult, simply do the above but with only tightening your abdominal muscles and holding it as directed.  Hip-Lift. Lie on your back with your knees flexed 90 degrees. Push down with your feet and shoulders as you raise your hips a couple inches off the floor; hold for 10 seconds, repeat 5 to 10 times.  Back arches. Lie on your stomach, propping yourself up on bent elbows. Slowly press on your hands, causing an arch in your low back. Repeat 3 to 5 times. Any initial stiffness and discomfort should lessen with repetition over time.  Shoulder-Lifts. Lie face down with arms beside your body. Keep hips and torso pressed to floor as you slowly lift your head and shoulders off the floor. Do not overdo your exercises, especially in the beginning. Exercises may cause you some mild back discomfort which lasts for a few minutes; however, if the pain is more severe, or lasts for more than 15 minutes, do not continue exercises until you see your caregiver.  Improvement with exercise therapy for back problems is slow.  See your caregivers for assistance with developing a proper back exercise program. Document Released: 07/08/2004 Document Revised: 08/23/2011 Document Reviewed: 04/01/2011 [facility] Patient Information 2015 [place], [place]. This information is not intended to replace advice given to you by your health care provider. Make sure you discuss any questions you have with your health care provider.  Please follow-up with your primary care provider and orthopedist for further evaluation and management. Please read attached information

## 2015-02-20 NOTE — ED Notes (Signed)
Pt has been packing and moving boxes; has chronic back pain in middle. States overdoing it. States takes flexeril and xanax and drying her mouth out. No incontinence.
